# Patient Record
Sex: Female | Born: 1985 | Race: White | Hispanic: No | Marital: Married
Health system: Midwestern US, Community
[De-identification: ages and names within clinical notes are randomized; demographics above are authoritative.]

## PROBLEM LIST (undated history)

## (undated) DIAGNOSIS — R569 Unspecified convulsions: Secondary | ICD-10-CM

## (undated) DIAGNOSIS — F32A Depression, unspecified: Secondary | ICD-10-CM

## (undated) DIAGNOSIS — F329 Major depressive disorder, single episode, unspecified: Secondary | ICD-10-CM

## (undated) DIAGNOSIS — F319 Bipolar disorder, unspecified: Secondary | ICD-10-CM

## (undated) DIAGNOSIS — F419 Anxiety disorder, unspecified: Secondary | ICD-10-CM

## (undated) HISTORY — PX: TONSILLECTOMY: SUR1361

## (undated) HISTORY — PX: THORACOTOMY: SUR1349

## (undated) HISTORY — DX: Major depressive disorder, single episode, unspecified: F32.9

## (undated) HISTORY — DX: Depression, unspecified: F32.A

## (undated) HISTORY — PX: TUBAL LIGATION: SHX77

## (undated) HISTORY — PX: DILATION AND CURETTAGE OF UTERUS: SHX78

---

## 2007-11-27 ENCOUNTER — Emergency Department: Payer: Self-pay | Admitting: Emergency Medicine

## 2008-03-03 ENCOUNTER — Emergency Department: Payer: Self-pay | Admitting: Emergency Medicine

## 2008-11-09 ENCOUNTER — Emergency Department: Payer: Self-pay | Admitting: Emergency Medicine

## 2009-03-03 ENCOUNTER — Observation Stay: Payer: Self-pay | Admitting: Obstetrics and Gynecology

## 2009-03-05 ENCOUNTER — Inpatient Hospital Stay: Payer: Self-pay

## 2011-06-07 ENCOUNTER — Emergency Department: Payer: Self-pay | Admitting: Emergency Medicine

## 2011-06-14 ENCOUNTER — Emergency Department: Payer: Self-pay | Admitting: Emergency Medicine

## 2011-10-24 ENCOUNTER — Emergency Department: Payer: Self-pay | Admitting: Unknown Physician Specialty

## 2012-02-10 ENCOUNTER — Emergency Department: Payer: Self-pay | Admitting: Emergency Medicine

## 2012-04-25 LAB — COMPREHENSIVE METABOLIC PANEL
Anion Gap: 7 (ref 7–16)
Co2: 27 mmol/L (ref 21–32)
Creatinine: 1.05 mg/dL (ref 0.60–1.30)
EGFR (African American): >60
EGFR (Non-African Amer.): >60
Glucose: 107 mg/dL — ABNORMAL HIGH (ref 65–99)
SGOT(AST): 19 U/L (ref 15–37)
Sodium: 141 mmol/L (ref 136–145)
Total Protein: 7.1 g/dL (ref 6.4–8.2)

## 2012-04-25 LAB — ETHANOL
Ethanol %: <0.003 % (ref 0.000–0.080)
Ethanol: <3 mg/dL

## 2012-04-25 LAB — DRUG SCREEN, URINE
Amphetamines, Ur Screen: NEGATIVE (ref ?–1000)
Benzodiazepine, Ur Scrn: POSITIVE (ref ?–200)
Cannabinoid 50 Ng, Ur ~~LOC~~: NEGATIVE (ref ?–50)
Phencyclidine (PCP) Ur S: NEGATIVE (ref ?–25)

## 2012-04-25 LAB — URINALYSIS, COMPLETE
Blood: NEGATIVE
Glucose,UR: NEGATIVE mg/dL (ref 0–75)
Ketone: NEGATIVE
Nitrite: NEGATIVE
Protein: NEGATIVE
RBC,UR: 2 /HPF (ref 0–5)

## 2012-04-25 LAB — CBC
HCT: 41 % (ref 35.0–47.0)
HGB: 13.7 g/dL (ref 12.0–16.0)
MCH: 31.1 pg (ref 26.0–34.0)
MCHC: 33.3 g/dL (ref 32.0–36.0)
RBC: 4.39 10*6/uL (ref 3.80–5.20)
RDW: 14.2 % (ref 11.5–14.5)
WBC: 9.8 10*3/uL (ref 3.6–11.0)

## 2012-04-25 LAB — TSH: Thyroid Stimulating Horm: 0.717 u[IU]/mL

## 2012-04-26 ENCOUNTER — Inpatient Hospital Stay: Payer: Self-pay | Admitting: Psychiatry

## 2012-05-06 ENCOUNTER — Emergency Department: Payer: Self-pay | Admitting: Emergency Medicine

## 2012-05-15 ENCOUNTER — Emergency Department: Payer: Self-pay | Admitting: Emergency Medicine

## 2012-05-15 LAB — URINALYSIS, COMPLETE
Ketone: NEGATIVE
Nitrite: NEGATIVE
Ph: 6 (ref 4.5–8.0)
Protein: NEGATIVE
RBC,UR: <1 /HPF (ref 0–5)
WBC UR: 3 /HPF (ref 0–5)

## 2012-05-15 LAB — CBC
MCH: 31.1 pg (ref 26.0–34.0)
MCV: 95 fL (ref 80–100)
Platelet: 190 10*3/uL (ref 150–440)
RBC: 4.24 10*6/uL (ref 3.80–5.20)
WBC: 9.5 10*3/uL (ref 3.6–11.0)

## 2012-05-15 LAB — BASIC METABOLIC PANEL
Anion Gap: 7 (ref 7–16)
BUN: 8 mg/dL (ref 7–18)
Chloride: 104 mmol/L (ref 98–107)
Co2: 29 mmol/L (ref 21–32)
EGFR (African American): >60
Glucose: 86 mg/dL (ref 65–99)
Osmolality: 277 (ref 275–301)
Sodium: 140 mmol/L (ref 136–145)

## 2012-05-15 LAB — HCG, QUANTITATIVE, PREGNANCY: Beta Hcg, Quant.: 17601 m[IU]/mL — ABNORMAL HIGH

## 2012-05-15 LAB — WET PREP, GENITAL

## 2012-08-23 ENCOUNTER — Observation Stay: Payer: Self-pay | Admitting: Obstetrics and Gynecology

## 2012-08-23 LAB — URINALYSIS, COMPLETE
Bilirubin,UR: NEGATIVE
Glucose,UR: NEGATIVE mg/dL (ref 0–75)
Ketone: NEGATIVE
Nitrite: NEGATIVE
Ph: 7 (ref 4.5–8.0)
RBC,UR: NONE SEEN /HPF (ref 0–5)
Squamous Epithelial: 5
WBC UR: 1 /HPF (ref 0–5)

## 2012-10-09 ENCOUNTER — Observation Stay: Payer: Self-pay | Admitting: Obstetrics and Gynecology

## 2012-10-09 LAB — URINALYSIS, COMPLETE
Blood: NEGATIVE
Glucose,UR: NEGATIVE mg/dL (ref 0–75)
Nitrite: NEGATIVE
Ph: 7 (ref 4.5–8.0)
Protein: NEGATIVE
Specific Gravity: 1.013 (ref 1.003–1.030)
WBC UR: 3 /HPF (ref 0–5)

## 2012-10-09 LAB — FETAL FIBRONECTIN
Appearance: NORMAL
Fetal Fibronectin: NEGATIVE

## 2012-12-08 ENCOUNTER — Observation Stay: Payer: Self-pay

## 2012-12-08 LAB — CBC WITH DIFFERENTIAL/PLATELET
Bands: 2 %
Eosinophil: 1 %
HCT: 25.8 % — ABNORMAL LOW (ref 35.0–47.0)
Lymphocytes: 21 %
MCHC: 32.9 g/dL (ref 32.0–36.0)
MCV: 82 fL (ref 80–100)
Monocytes: 8 %
NRBC/100 WBC: 1 /
RBC: 3.16 10*6/uL — ABNORMAL LOW (ref 3.80–5.20)
Segmented Neutrophils: 68 %

## 2013-01-05 ENCOUNTER — Observation Stay: Payer: Self-pay | Admitting: Obstetrics & Gynecology

## 2013-01-07 ENCOUNTER — Inpatient Hospital Stay: Payer: Self-pay | Admitting: Obstetrics & Gynecology

## 2013-01-07 LAB — CBC WITH DIFFERENTIAL/PLATELET
Bands: 2 %
HCT: 25 % — ABNORMAL LOW (ref 35.0–47.0)
Lymphocytes: 17 %
MCH: 24.3 pg — ABNORMAL LOW (ref 26.0–34.0)
MCHC: 31.7 g/dL — ABNORMAL LOW (ref 32.0–36.0)
MCV: 77 fL — ABNORMAL LOW (ref 80–100)
Metamyelocyte: 2 %
Monocytes: 5 %
Platelet: 184 10*3/uL (ref 150–440)
RBC: 3.26 10*6/uL — ABNORMAL LOW (ref 3.80–5.20)
Segmented Neutrophils: 73 %
WBC: 14 10*3/uL — ABNORMAL HIGH (ref 3.6–11.0)

## 2013-01-08 LAB — HEMATOCRIT: HCT: 20.9 % — ABNORMAL LOW (ref 35.0–47.0)

## 2013-01-08 LAB — HEMOGLOBIN: HGB: 6.8 g/dL — ABNORMAL LOW (ref 12.0–16.0)

## 2013-01-09 LAB — HEMATOCRIT: HCT: 25.2 % — ABNORMAL LOW (ref 35.0–47.0)

## 2013-01-09 LAB — PATHOLOGY REPORT

## 2014-12-27 ENCOUNTER — Emergency Department: Payer: Self-pay | Admitting: Emergency Medicine

## 2015-01-18 ENCOUNTER — Inpatient Hospital Stay: Payer: Self-pay | Admitting: Psychiatry

## 2015-02-25 NOTE — Op Note (Signed)
PATIENT NAME:  Alicia ShinerLMER, Ayesha MR#:  [id number] DATE OF BIRTH:  08/06/1986  DATE OF PROCEDURE:  12/08/2012  PREOPERATIVE DIAGNOSES:  1.  Cervical incompetence.  2.  Cervical cerclage.  3.  Intrauterine pregnancy at 35 weeks and 5 days gestational age.   POSTOPERATIVE DIAGNOSES:  1.  Cervical incompetence.  2.  Cervical cerclage.  3.  Intrauterine pregnancy at 35 weeks and 5 days gestational age.   PROCEDURE: Removal of cerclage.   SURGEON: Thomasene MohairStephen Zeynab Klett, M.D.   ASSISTANT SURGEON: Marta Antuamara Brothers, CNM.   ANESTHESIA: Spinal.   ESTIMATED BLOOD LOSS: 50 mL.   OPERATIVE FLUIDS: 300 mL crystalloid.   COMPLICATIONS: None.   FINDINGS:  1.  Mersilene tape with knot at posterior cervix.  2.  Cervix dilated at 2 cm at the end of the procedure.   SPECIMENS: None.   CONDITION AT END OF PROCEDURE: Stable.   PROCEDURE IN DETAIL: The patient was taken to the operating room where spinal anesthesia was administered and found to be adequate. She was then placed in the dorsal supine high lithotomy position in candy cane stirrups and then prepped and draped in the usual sterile fashion. After a timeout was called, the bladder was straight catheterized for return of about 50 mL of clear urine. A small weighted speculum was placed in the posterior vagina and a right angle retractor was used to retract the anterior vaginal wall and a ring forceps was used to grasp the posterior lip of the cervix and deflect it anteriorly so that the posterior knot could be appreciated. The knot was grasped with Allis clamps and then this was followed as the knot was somewhat embedded into the cervix. Extraction with gentle traction was placed and the knot was gently pulled out of the cervical embedding. Once the cervical embedding portion of the knot was appreciated, the portion of the knot that was embedded in the cervix was removed completely from the cervix and the cerclage could be visualized. It was cut with suture  scissors, and the entire cerclage was removed from the cervix. There was one portion of the cervix that was bleeding and a single stitch of figure-of-eight with 3-0 Vicryl was thrown for hemostasis. At the end of the procedure, the cervix was checked again for any other knots or additional stitches that might have been thrown and none were found. Cervix was then checked and was found to be dilated 2 cm. Hemostasis was obtained with just mild oozing at the end of the procedure.   This concluded the dictation. The patient was taken to the PACU in stable condition. All instrument, needle and sponge counts were correct. The vagina was checked at the end of procedure for sponges and none were left in the vagina.  ____________________________ Conard NovakStephen D. Shenea Giacobbe, MD sdj:aw D: 12/08/2012 22:55:14 ET T: 12/09/2012 11:08:51 ET JOB#: [id number]  cc: Conard NovakStephen D. Christophere Hillhouse, MD, <Dictator> Conard NovakSTEPHEN D Edeline Greening MD ELECTRONICALLY SIGNED 01/06/2013 21:13

## 2015-02-25 NOTE — Op Note (Signed)
PATIENT NAME:  Alicia Whitney, Alicia Whitney MR#:  [id number] DATE OF BIRTH:  15-Feb-1986  DATE OF PROCEDURE:  01/08/2013  PREOPERATIVE DIAGNOSES:  1. Term intrauterine pregnancy. 2. Failure to progress. 3. Cephalopelvic disproportion. 4. Desire for permanent sterility.   POSTOPERATIVE DIAGNOSES: 1. Term intrauterine pregnancy.  2. Failure to progress.  3. Cephalopelvic disproportion.  4. Desire for permanent sterility.   PROCEDURE:  1. Cesarean section.  2. Bilateral tubal ligation.   SURGEON: Dierdre Searles. Paul Harris, MD   ASSISTANT: Farrel Connersolleen Gutierrez, CNM   ANESTHESIA: Spinal.   ESTIMATED BLOOD LOSS: 100 mL.   COMPLICATIONS: None.   FINDINGS: Normal tubes, ovaries, and uterus. Viable female infant weighing 7 pounds 15 ounces with Apgar scores of 8 and 9 at 1 and 5 minutes, respectively.   DISPOSITION: To the recovery room in stable condition.   TECHNIQUE: The patient is prepped and draped in the usual sterile fashion after adequate anesthesia is obtained in the supine position on the operating room table. A scalpel is used to create a low transverse skin incision down to the level of the rectus fascia which is dissected bilaterally using Mayo scissors. The rectus muscles are then separated from the fascia and the midline, and the peritoneum is penetrated with dissection and retraction of bladder in an inferior fashion. A scalpel is then used to create a low transverse hysterotomy incision that is then extended by blunt dissection. Amniotomy reveals meconium. The infant's head is delivered without complication or need of a vacuum device. There is no nuchal cord noted. The infant is quickly delivered and once delivered is noted to have vigorous cry. The umbilical cord is clamped and cut, and the infant is handed to the pediatric team to continue resuscitative measures.   Cord blood is obtained and the placenta is manually extracted. The uterus is externalized and cleansed of all membranes and debris using a  moist sponge. The hysterotomy incision is closed with a running #1 Vicryl suture in a locking fashion followed by a second layer to embrocate the first layer with excellent hemostasis noted. Interceed is placed over the incision.   The uterus is placed back in the intra-abdominal cavity, and the paracolic gutters are irrigated with warm saline.   While the uterus is externalized, the left and right fallopian tubes are grasped with a Babcock clamp, and a loop is tied with 2 Vicryl sutures, excised, and cauterized. Excellent hemostasis is noted at the tubal sites. Tubes are sent to Pathology for further review.   The peritoneum is closed with a 0 Vicryl suture. Trocars are placed through the abdominal wall into the subfascial space and are used to thread Silver Soaker catheters into the subfascial space for administration of continuous infusion of bupivacaine to the On-Q pump. The rectus fascia is closed with a 0 Maxon suture. Subcutaneous tissues are irrigated and hemostasis is assured using electrocautery. Skin is closed with a 4-0 Vicryl suture in a subcuticular fashion, followed by placement of Dermabond over the skin and to stabilize the catheters in place. The catheters are flushed with 5 mL each of bupivacaine and then stabilized further in place with Steri-Strips and a Tegaderm bandage. The patient goes to the recovery room in stable condition. All sponge, instrument and needle counts are correct.  ____________________________ R. Annamarie MajorPaul Harris, MD rph:cb D: 01/08/2013 09:26:07 ET T: 01/08/2013 09:35:25 ET JOB#: [id number]  cc: Dierdre Searles. Paul Harris, MD, <Dictator> Nadara MustardOBERT P HARRIS MD ELECTRONICALLY SIGNED 01/09/2013 7:42
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
## 2015-03-06 NOTE — Consult Note (Signed)
PATIENT NAME:  Alicia Whitney, PRUETT MR#:  [id number] DATE OF BIRTH:  03-10-1986  DATE OF CONSULTATION:  01/17/2015  REFERRING PHYSICIAN:   CONSULTING PHYSICIAN:  Audery Amel, MD  IDENTIFYING INFORMATION AND REASON FOR CONSULTATION: A 29 year old woman with a history of depression who presented to the Emergency Room.   CHIEF COMPLAINT: "I'm under a lot of stress."   HISTORY OF PRESENT ILLNESS: The patient tearfully states that her depression has become overwhelming over the last week or so. She has multiple stresses including mourning over her children, who she has all had taken away from her one way or another. She feels like her living situation is very stressful, living with her boyfriend and his family as well. She had had a depressed mood with negative feelings about herself constantly. Sleeping very poorly. Has nightmares at night. Has been having suicidal ideation, specifically with thoughts about cutting herself. She has not yet acted on it. She is not having any homicidal ideation; denies any hallucinations or psychotic symptoms. The patient is not on any psychiatric medicine and has not been getting followup treatment for months. She also reports that she has been drinking increasingly over the last couple of months. Still says that she does not drink very much, but it has been going up. Also, finally admits that she has been using cocaine a couple of times a week over the last month or so.   PAST PSYCHIATRIC HISTORY: History of depression and possibly posttraumatic stress disorder; has been treated with antidepressants in the past. Last on Zoloft, Atarax, and trazodone combination while living in [place] earlier last year. She has a history of multiple suicide attempts and self-mutilations. Several hospitalizations.   PAST MEDICAL HISTORY: History of self-mutilation, but otherwise no significant medical problems.   SOCIAL HISTORY: The patient is living with her boyfriend as well as several  members of his extended family. She has had several children, but apparently all of them have been taken, either by DSS custody or in custody disputes involving other members of her family. She works part time at [employer]. [employer]ota.   SUBSTANCE ABUSE HISTORY: Previously had not had a significant substance abuse problem but says that over the last several months she has been drinking increasing amounts. Also using cocaine (Dictation Anomaly) <<regularlyMISSING TEXT>>.   FAMILY HISTORY: She says multiple people on her mother's side of the family have depression and anxiety problems.   CURRENT MEDICATIONS: None.   ALLERGIES: ONIONS.   REVIEW OF SYSTEMS: Depressed mood. Poor sleep. Frequent nightmares. Positive suicidal ideation.   MENTAL STATUS EXAMINATION: Disheveled woman, looks overwrought. Cooperative with the interview. Eye contact poor. Psychomotor activity fidgety, frequently wracked by sobs during the interview. Affect is tearful and sobbing. Mood is stated as being very depressed. Thoughts are a little bit scattered and disorganized, but not psychotic. Denies auditory or visual hallucinations. Endorses suicidal ideation; no homicidal ideation. The patient is alert and oriented x 4, can repeat 3 objects immediately, remembers 2 out of 3 at 3 minutes. Judgment and insight at least partially intact. Intelligence normal.   LABORATORY RESULTS: Alcohol level negative. Chemistry panel all unremarkable. Drug screen positive for cocaine. CBC all unremarkable. Urinalysis: Trace leukocyte esterase, 1+ bacteria, probably does represent a urinary tract infection, although with only small numbers of white cells. Urine pregnancy test negative.   VITAL SIGNS: Blood pressure in the Emergency Room is 111/79, respirations 18, pulse 86, temperature 98.2.   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 The patient is having active suicidal ideation and has a history of suicide attempts in the past. Would benefit from hospitalization for safety and stabilization.   TREATMENT PLAN: Admit to psychiatry. Patient will be restarted on Zoloft, Atarax, and trazodone, as she found that combination helpful in the past. Suicide precautions and close precautions will be in effect. She will get treated for the probable urinary tract infection.   DIAGNOSIS, PRINCIPAL AND PRIMARY: AXIS I: Major depression, severe, recurrent.   SECONDARY DIAGNOSES:  AXIS I: 1.  Posttraumatic stress disorder.  2.  Cocaine abuse, moderate.  3.  Alcohol abuse, moderate.  AXIS II: Deferred.  AXIS III: Urinary tract infection.   ____________________________ Audery AmelJohn T. Clapacs, MD jtc:ST D: 01/17/2015 22:33:54 ET T: 01/17/2015 23:23:25 ET JOB#: [id number]  cc: Audery AmelJohn T. Clapacs, MD, <Dictator> Audery AmelJOHN T CLAPACS MD ELECTRONICALLY SIGNED 01/21/2015 10:17
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
## 2015-03-15 NOTE — H&P (Signed)
L&D Evaluation:  History:   HPI 29 yo [id number] at 2611w4d [facility] presenting with abdominal cramping and nausea/emesis and inability to keep food down for past day or so.  She has had cramps throughout her pregnancy.  This most recent cramping began last night and has persisted.  She notes no vaginal bleeding or big gush of fluid.  She notes positive fetal movement.  In addition given her history of cervical insufficiency she has a merselene [facility] as [facility].  She has been mostly getting 17ohpc since early second trimester for history of 23 week delivery. She    Presents with abdominal pain, nausea/vomiting    Patient's Medical History Anxiety, depression, bipolar disorder, history of seizure disorder (last seizure at age 29),    Patient's Surgical History D&C  cerclage placement, tonsillectomy    Medications Pre [place]atal Vitamins  Iron    Allergies NKDA    Social History none    Family History Non-Contributory   ROS:   ROS All systems were reviewed.  HEENT, CNS, GI, GU, Respiratory, CV, Renal and Musculoskeletal systems were found to be normal.   Exam:   Vital Signs stable    Urine Protein not completed    General no apparent distress    Mental Status clear    Abdomen gravid, non-tender    Estimated Fetal Weight Average for gestational age    Back no CVAT    Edema no edema    Pelvic no external lesions, cervix closed and thick, cerclage in place, no tension noted on cerclage, and no old blood or active bleeding noted on exam    Mebranes Intact, negative wet mount and ferning    FHT normal rate with no decels    Ucx absent    Other UA (pertinent only): trace LE, 1+ bacteria Wet Mount: + trich (diagnosed 2'ish weeks ago and treated) fFN negative   Impression:   Impression abdominal cramping, nausea and vomiting (not tolerating food by mouth)   Plan:   Plan UA, discharge    Comments - Gave another treatment dose for trichamonas -  send UA for culture - phenergan given with PO challenge (send home with Rx) - she is status post 1 liter of NS IV.    Follow Up Appointment already scheduled   Electronic Signatures: Conard NovakJackson, Burkley Dech D (MD)  (Signed 05-Dec-13 22:20)  Authored: L&D Evaluation   Last Updated: 05-Dec-13 22:20 by Conard NovakJackson, Lorna Strother D (MD)

## 2015-03-15 NOTE — H&P (Signed)
L&D Evaluation:  History:  HPI Alicia Whitney G4P1102 at 3872w0d by 10wk U/S presents with c/o contractions.  Ob history complicated by h/o 23wk PTB, with cerclage and 17OHP this pregnancy (cerclage removed previously).  Last cervical exam a few days ago 2cm.  [facility]  PNL - AB+, RI, VI, chlam + with neg TOC 09/22/12, trich s/p treatment.   Presents with contractions   Patient's Medical History Anemia, bipolar, depression   Patient's Surgical History cerclage, D&C, tonsillectomy, ear lobe repair   Medications Pre Natal Vitamins   Allergies NKDA   Social History none   Family History Non-Contributory   ROS:  ROS All systems were reviewed.  HEENT, CNS, GI, GU, Respiratory, CV, Renal and Musculoskeletal systems were found to be normal.   Exam:  Vital Signs stable   General no apparent distress   Mental Status clear   Chest nl effort   Abdomen gravid, non-tender   Estimated Fetal Weight Average for gestational age   Pelvic 2.5 / 25 / -2 per RN   Mebranes Intact   FHT normal rate with no decels, reactive NST   Ucx irregular, q3-505mins   Impression:  Impression [id number] at 5172w0d in latent labor   Plan:  Plan discharge   Comments - labor precautions reviewed   Follow Up Appointment already scheduled   Electronic Signatures: Orvan FalconerStansbury Clipp, Cilicia Borden K (MD)  (Signed 03-Mar-14 01:11)  Authored: L&D Evaluation   Last Updated: 03-Mar-14 01:11 by Garnette GunnerStansbury Clipp, Ali LoweEryn K (MD)

## 2015-03-15 NOTE — H&P (Signed)
L&D Evaluation:  History:   HPI 29 yo [id number] at [facility]red[facility] presenting with vaginal bleeding.  The patient went to the bathroom this evening and passed a quarter size clot. No further bleeding since that time.  Reports some dysuria as well as intermitten sharp pain bilateral groins, worse with movement and standing.  She has a history of a prior UTI this pregnancy [facility].  In addition given her history of cervical insufficiency she has a merselene [facility].  She was receiving progestone injection on top of the cerclage but has not received any in the last 3 weeks as she transitioned back [facility].  She is scheduled for anatomy US on 08/28/2011.    Presents with abdominal pain, vaginal bleeding    Patient's Medical History Anxiety, depression, bipolar disorder, history of seizure disorder (last seizure at age 29),    Patient's Surgical History D&C  cerclage placement, tonsillectomy    Medications Pre [place]atal Vitamins  Iron    Allergies NKDA    Social History none    Family History Non-Contributory   ROS:   ROS 10 point review of systems negtive unless otherwise noted in HPI   Exam:   Vital Signs stable    Urine Protein not completed, UA pending    General no apparent distress    Mental Status clear    Abdomen gravid, non-tender    Estimated Fetal Weight Average for gestational age    Back no CVAT    Edema no edema    Pelvic no external lesions, cervix closed and thick, cerclage in place, no tension noted on cerclage, and no old blood or active bleeding noted on exam    Mebranes Intact, negative wet mount and ferning    FHT +FHT's    Ucx absent   Impression:   Impression Vaginal bleeding and dysuria   Plan:   Comments - No evidence of any bleeding on exam today, may be cervical mucous, irritation from cerclage, or urinary is origin.  Does ont appear to be labor or cerclage pulling though  cervix. - Given dysuria, history of UTI will send UA and monitor contractions in the interim.  No contractions noted in first 20 minutes of monitoring.    Follow Up Appointment already scheduled   Electronic Signatures for Addendum Section:  Lorrene ReidStaebler, Niaja Stickley M (MD) (Signed Addendum 19-Oct-13 23:34)  UA reviewed not consistent with UTI given routine preterm labor precuations follow up in clinic as noted above   Electronic Signatures: Lorrene ReidStaebler, Maryalyce Sanjuan M (MD)  (Signed 19-Oct-13 23:12)  Authored: L&D Evaluation   Last Updated: 19-Oct-13 23:34 by Lorrene ReidStaebler, Sapphira Harjo M (MD)

## 2016-04-25 ENCOUNTER — Emergency Department: Payer: Self-pay

## 2016-04-25 ENCOUNTER — Emergency Department
Admission: EM | Admit: 2016-04-25 | Discharge: 2016-04-25 | Disposition: A | Discharge status: Home / Self Care | Payer: Self-pay | Attending: Emergency Medicine | Admitting: Emergency Medicine

## 2016-04-25 ENCOUNTER — Encounter: Payer: Self-pay | Admitting: Emergency Medicine

## 2016-04-25 DIAGNOSIS — J189 Pneumonia, unspecified organism: Secondary | ICD-10-CM

## 2016-04-25 DIAGNOSIS — J181 Lobar pneumonia, unspecified organism: Secondary | ICD-10-CM | POA: Insufficient documentation

## 2016-04-25 DIAGNOSIS — R509 Fever, unspecified: Secondary | ICD-10-CM

## 2016-04-25 DIAGNOSIS — Z87891 Personal history of nicotine dependence: Secondary | ICD-10-CM | POA: Insufficient documentation

## 2016-04-25 DIAGNOSIS — N83201 Unspecified ovarian cyst, right side: Secondary | ICD-10-CM | POA: Insufficient documentation

## 2016-04-25 DIAGNOSIS — R109 Unspecified abdominal pain: Secondary | ICD-10-CM

## 2016-04-25 DIAGNOSIS — Z79899 Other long term (current) drug therapy: Secondary | ICD-10-CM | POA: Insufficient documentation

## 2016-04-25 LAB — URINALYSIS COMPLETE WITH MICROSCOPIC (ARMC ONLY)
Bilirubin Urine: NEGATIVE
Glucose, UA: NEGATIVE mg/dL
HGB URINE DIPSTICK: NEGATIVE
Ketones, ur: NEGATIVE mg/dL
NITRITE: NEGATIVE
PH: 7 (ref 5.0–8.0)
PROTEIN: NEGATIVE mg/dL
Specific Gravity, Urine: 1.005 (ref 1.005–1.030)

## 2016-04-25 LAB — CBC
HEMATOCRIT: 39.5 % (ref 35.0–47.0)
HEMOGLOBIN: 13.3 g/dL (ref 12.0–16.0)
MCH: 30.8 pg (ref 26.0–34.0)
MCHC: 33.6 g/dL (ref 32.0–36.0)
MCV: 91.8 fL (ref 80.0–100.0)
Platelets: 181 10*3/uL (ref 150–440)
RBC: 4.3 MIL/uL (ref 3.80–5.20)
RDW: 12.9 % (ref 11.5–14.5)
WBC: 6.1 10*3/uL (ref 3.6–11.0)

## 2016-04-25 LAB — LIPASE, BLOOD: Lipase: 25 U/L (ref 11–51)

## 2016-04-25 LAB — COMPREHENSIVE METABOLIC PANEL
ALBUMIN: 4.6 g/dL (ref 3.5–5.0)
ALT: 8 U/L — AB (ref 14–54)
ANION GAP: 7 (ref 5–15)
AST: 16 U/L (ref 15–41)
Alkaline Phosphatase: 67 U/L (ref 38–126)
BILIRUBIN TOTAL: 0.7 mg/dL (ref 0.3–1.2)
BUN: 12 mg/dL (ref 6–20)
CHLORIDE: 106 mmol/L (ref 101–111)
CO2: 26 mmol/L (ref 22–32)
Calcium: 9.5 mg/dL (ref 8.9–10.3)
Creatinine, Ser: 0.78 mg/dL (ref 0.44–1.00)
GFR calc Af Amer: >60 mL/min (ref >60)
GLUCOSE: 103 mg/dL — AB (ref 65–99)
POTASSIUM: 4 mmol/L (ref 3.5–5.1)
Sodium: 139 mmol/L (ref 135–145)
TOTAL PROTEIN: 7.6 g/dL (ref 6.5–8.1)

## 2016-04-25 MED ORDER — MORPHINE SULFATE (PF) 4 MG/ML IV SOLN
4.0000 mg | Freq: Once | INTRAVENOUS | Status: AC
  Administered 2016-04-25: 4 mg via INTRAVENOUS
  Filled 2016-04-25: qty 1

## 2016-04-25 MED ORDER — IOPAMIDOL (ISOVUE-300) INJECTION 61%
100.0000 mL | Freq: Once | INTRAVENOUS | Status: AC | PRN
  Administered 2016-04-25: 100 mL via INTRAVENOUS

## 2016-04-25 MED ORDER — SODIUM CHLORIDE 0.9 % IV BOLUS (SEPSIS)
1000.0000 mL | Freq: Once | INTRAVENOUS | Status: AC
  Administered 2016-04-25: 1000 mL via INTRAVENOUS

## 2016-04-25 MED ORDER — ONDANSETRON HCL 4 MG/2ML IJ SOLN
4.0000 mg | Freq: Once | INTRAMUSCULAR | Status: AC
  Administered 2016-04-25: 4 mg via INTRAVENOUS
  Filled 2016-04-25: qty 2

## 2016-04-25 MED ORDER — TRAMADOL HCL 50 MG PO TABS: 50.0000 mg | ORAL_TABLET | Freq: Four times a day (QID) | ORAL | Status: DC | PRN

## 2016-04-25 MED ORDER — DIATRIZOATE MEGLUMINE & SODIUM 66-10 % PO SOLN
15.0000 mL | Freq: Once | ORAL | Status: AC
  Administered 2016-04-25: 15 mL via ORAL

## 2016-04-25 MED ORDER — AMOXICILLIN-POT CLAVULANATE 875-125 MG PO TABS: 1.0000 | ORAL_TABLET | Freq: Two times a day (BID) | ORAL | Status: AC

## 2016-04-25 MED ORDER — DEXTROSE 5 % IV SOLN
1.0000 g | Freq: Once | INTRAVENOUS | Status: AC
  Administered 2016-04-25: 1 g via INTRAVENOUS
  Filled 2016-04-25: qty 10

## 2016-04-25 MED ORDER — KETOROLAC TROMETHAMINE 30 MG/ML IJ SOLN
30.0000 mg | Freq: Once | INTRAMUSCULAR | Status: AC
  Administered 2016-04-25: 30 mg via INTRAVENOUS
  Filled 2016-04-25: qty 1

## 2016-04-25 MED ORDER — IBUPROFEN 800 MG PO TABS: 800.0000 mg | ORAL_TABLET | Freq: Three times a day (TID) | ORAL | Status: DC | PRN

## 2016-04-25 NOTE — ED Notes (Signed)
Patient presents to the ED with right lower quadrant pain, dizziness, nausea and vomiting with fever of 103 at 6am.  Patient states she took tylenol.  Patient is guarding her right lower quadrant pain.  Patient reports having had a tubal ligation.  Pressure to the RLQ improves pain with severe rebound tenderness.  Patient appears uncomfortable during triage.

## 2016-04-25 NOTE — ED Provider Notes (Signed)
[facility] Emergency Department Provider Note   ____________________________________________  Time seen: Approximately 12:16 PM  I have reviewed the triage vital signs and the nursing notes.   HISTORY  Chief Complaint Abdominal Pain and Emesis    HPI Alicia Whitney is a 30 y.o. female who is complaining of diffuse mid abdominal pain that has progressively worsened since early this morning. Patient states she had a fever of 103 as well as she has vomited several times. Last emesis was on arrival to the ER. She denies any dysuria frequency or hematuria. She denies any diarrhea or constipation. She also denies any cough or cold symptoms. Patient states her pain on scale of 0-10 at this time is a 10.   History reviewed. No pertinent past medical history.  There are no active problems to display for this patient.   History reviewed. No pertinent past surgical history.  Current Outpatient Rx  Name  Route  Sig  Dispense  Refill  . acetaminophen (TYLENOL) 325 MG tablet   Oral   Take 650 mg by mouth every 6 (six) hours as needed for moderate pain.         Marland Kitchen amoxicillin-clavulanate (AUGMENTIN) 875-125 MG tablet   Oral   Take 1 tablet by mouth every 12 (twelve) hours.   20 tablet   0   . ibuprofen (ADVIL,MOTRIN) 800 MG tablet   Oral   Take 1 tablet (800 mg total) by mouth every 8 (eight) hours as needed.   30 tablet   0   . traMADol (ULTRAM) 50 MG tablet   Oral   Take 1 tablet (50 mg total) by mouth every 6 (six) hours as needed.   20 tablet   0     Allergies Review of patient's allergies indicates no known allergies.  No family history on file.  Social History Social History  Substance Use Topics  . Smoking status: Former Games developer  . Smokeless tobacco: None  . Alcohol Use: No    Review of Systems Constitutional: No fever/chills Eyes: No visual changes. ENT: No sore throat. Cardiovascular: Denies chest pain. Respiratory: Denies  shortness of breath. Gastrointestinal: Positive for abdominal pain. Nausea for nausea and vomiting several times.  No diarrhea.  No constipation. Genitourinary: Negative for dysuria. Musculoskeletal: Negative for back pain. Skin: Negative for rash. Neurological: Negative for headaches, focal weakness or numbness.  10-point ROS otherwise negative.  ____________________________________________   PHYSICAL EXAM:  VITAL SIGNS: ED Triage Vitals  Enc Vitals Group     BP 04/25/16 1131 125/87 mmHg     Pulse Rate 04/25/16 1131 92     Resp 04/25/16 1131 18     Temp 04/25/16 1131 98.7 F (37.1 C)     Temp Source 04/25/16 1131 Oral     SpO2 04/25/16 1131 99 %     Weight 04/25/16 1131 132 lb (59.875 kg)     Height 04/25/16 1131 5\' 6"  (1.676 m)     Head Cir --      Peak Flow --      Pain Score 04/25/16 1132 6     Pain Loc --      Pain Edu? --      Excl. in GC? --     Constitutional: Alert and oriented. Well appearing and in 100 acute distress from her pain. Eyes: Conjunctivae are normal. PERRL. EOMI. Head: Atraumatic. Nose: No congestion/rhinnorhea. Mouth/Throat: Mucous membranes are moist.  Oropharynx non-erythematous. Neck: No stridor.   Cardiovascular: Normal rate,  regular rhythm. Grossly normal heart sounds.  Good peripheral circulation. Respiratory: Normal respiratory effort.  No retractions. Lungs CTAB. Gastrointestinal: Soft and mild to moderate tenderness in her periumbilical and suprapubic area, no rebound or guarding or peritoneal signs. No distention. No abdominal bruits. No CVA tenderness. Musculoskeletal: No lower extremity tenderness nor edema.  No joint effusions. Neurologic:  Normal speech and language. No gross focal neurologic deficits are appreciated. No gait instability. Skin:  Skin is warm, dry and intact. No rash noted. Psychiatric: Mood and affect are normal. Speech and behavior are normal.  ____________________________________________   LABS (all labs  ordered are listed, but only abnormal results are displayed)  Labs Reviewed  COMPREHENSIVE METABOLIC PANEL - Abnormal; Notable for the following:    Glucose, Bld 103 (*)    ALT 8 (*)    All other components within normal limits  URINALYSIS COMPLETEWITH MICROSCOPIC ([facility] ONLY) - Abnormal; Notable for the following:    Color, Urine STRAW (*)    APPearance CLEAR (*)    Leukocytes, UA TRACE (*)    Bacteria, UA RARE (*)    Squamous Epithelial / LPF 0-5 (*)    All other components within normal limits  CULTURE, BLOOD (ROUTINE X 2)  CULTURE, BLOOD (ROUTINE X 2)  LIPASE, BLOOD  CBC  POC URINE PREG, ED   ____________________________________________  EKG   ____________________________________________  RADIOLOGY  Dg Chest 2 View  04/25/2016  CLINICAL DATA:  30 year old female with history of right lower quadrant abdominal pain. Dizziness and nausea with vomiting and fever to 103 degrees this morning at 6 a.m. Right lower quadrant abdominal pain. EXAM: CHEST  2 VIEW COMPARISON:  No priors. FINDINGS: Lung volumes are normal. No consolidative airspace disease. No pleural effusions. No pneumothorax. No pulmonary nodule or mass noted. Pulmonary vasculature and the cardiomediastinal silhouette are within normal limits. IMPRESSION: No radiographic evidence of acute cardiopulmonary disease. Electronically Signed   By: Trudie Reedaniel  Entrikin M.D.   On: 04/25/2016 13:42   Ct Abdomen Pelvis W Contrast  04/25/2016  CLINICAL DATA:  Right lower quadrant abdominal pain with fever, nausea, and vomiting starting this morning. EXAM: CT ABDOMEN AND PELVIS WITH CONTRAST TECHNIQUE: Multidetector CT imaging of the abdomen and pelvis was performed using the standard protocol following bolus administration of intravenous contrast. CONTRAST:  100mL ISOVUE-300 IOPAMIDOL (ISOVUE-300) INJECTION 61% COMPARISON:  12/27/2014 FINDINGS: Lower chest: 1.3 by 0.9 cm focal airspace opacity in the left lower lobe, image 11/4, not  present on 12/27/2014 Hepatobiliary: Unremarkable Pancreas: Unremarkable Spleen: Unremarkable Adrenals/Urinary Tract: Unremarkable Stomach/Bowel: Unremarkable.  Appendix normal. Vascular/Lymphatic: Unremarkable Reproductive: Mildly retroverted uterus. Ovarian contours normal. 2.1 by 1.6 cm cyst of the right ovary inferiorly. Other: No supplemental non-categorized findings. Musculoskeletal: Unremarkable IMPRESSION: 1. Small focal likely inflammatory airspace opacity in the left lower lobe medially. This may reflect atelectasis or less likely a small focus of pneumonia. This was not present on the prior exam from last year. Particularly in light of the patient's age, I am not suspicious or specially concerned about malignancy. 2. Small right ovarian cyst. 3. The appendix appears normal and the cause for the patient' s right lower quadrant abdominal pain is not readily apparent. Electronically Signed   By: Gaylyn RongWalter  Liebkemann M.D.   On: 04/25/2016 15:24    ____________________________________________   PROCEDURES  Procedure(s) performed: None  Critical Care performed: No  ____________________________________________   INITIAL IMPRESSION / ASSESSMENT AND PLAN / ED COURSE  Pertinent labs & imaging results that were available during my care  of the patient were reviewed by me and considered in my medical decision making (see chart for details).  3:43 PM Patient was given IV fluids pain and nausea medicine while awaiting labs and CT.  3:43 PM Patient's CT abdomen pelvis shows an early pneumonia in her left lower lobe well as a small right ovarian cyst. The fever is probably explained per the pneumonia and we will give patient follow up with OB/GYN concerning her right ovarian cyst. Patient is going to be given a dose IV Rocephin as well as some IV Toradol prior to discharge. Patient will be sent home on Augmentin, ibuprofen, and tramadol. ____________________________________________   FINAL  CLINICAL IMPRESSION(S) / ED DIAGNOSES  Final diagnoses:  Fever and chills  Acute abdominal pain  Right ovarian cyst  Left lower lobe pneumonia      NEW MEDICATIONS STARTED DURING THIS VISIT:  New Prescriptions   AMOXICILLIN-CLAVULANATE (AUGMENTIN) 875-125 MG TABLET    Take 1 tablet by mouth every 12 (twelve) hours.   IBUPROFEN (ADVIL,MOTRIN) 800 MG TABLET    Take 1 tablet (800 mg total) by mouth every 8 (eight) hours as needed.   TRAMADOL (ULTRAM) 50 MG TABLET    Take 1 tablet (50 mg total) by mouth every 6 (six) hours as needed.     Note:  This document was prepared using Dragon voice recognition software and may include unintentional dictation errors.    Leona Carry, MD 04/25/16 [id number]

## 2016-04-25 NOTE — ED Notes (Signed)
Report received from Janie, RN. This RN assumed patient care. 

## 2016-04-25 NOTE — ED Notes (Signed)

## 2016-04-30 LAB — CULTURE, BLOOD (ROUTINE X 2)
Culture: NO GROWTH
Culture: NO GROWTH

## 2016-09-17 ENCOUNTER — Emergency Department
Admission: EM | Admit: 2016-09-17 | Discharge: 2016-09-17 | Disposition: A | Discharge status: Home / Self Care | Payer: No Typology Code available for payment source | Attending: Emergency Medicine | Admitting: Emergency Medicine

## 2016-09-17 ENCOUNTER — Emergency Department: Payer: No Typology Code available for payment source

## 2016-09-17 DIAGNOSIS — Y999 Unspecified external cause status: Secondary | ICD-10-CM | POA: Diagnosis not present

## 2016-09-17 DIAGNOSIS — S99922A Unspecified injury of left foot, initial encounter: Secondary | ICD-10-CM | POA: Diagnosis present

## 2016-09-17 DIAGNOSIS — S9032XA Contusion of left foot, initial encounter: Secondary | ICD-10-CM | POA: Insufficient documentation

## 2016-09-17 DIAGNOSIS — Y92481 Parking lot as the place of occurrence of the external cause: Secondary | ICD-10-CM | POA: Diagnosis not present

## 2016-09-17 DIAGNOSIS — Y9389 Activity, other specified: Secondary | ICD-10-CM | POA: Insufficient documentation

## 2016-09-17 DIAGNOSIS — Z87891 Personal history of nicotine dependence: Secondary | ICD-10-CM | POA: Diagnosis not present

## 2016-09-17 DIAGNOSIS — Z79899 Other long term (current) drug therapy: Secondary | ICD-10-CM | POA: Diagnosis not present

## 2016-09-17 LAB — POCT PREGNANCY, URINE: PREG TEST UR: NEGATIVE

## 2016-09-17 MED ORDER — MELOXICAM 15 MG PO TABS: 15.0000 mg | ORAL_TABLET | Freq: Every day | ORAL | 0 refills | Status: DC

## 2016-09-17 MED ORDER — MORPHINE SULFATE (PF) 4 MG/ML IV SOLN
4.0000 mg | Freq: Once | INTRAVENOUS | Status: DC
  Filled 2016-09-17: qty 1

## 2016-09-17 MED ORDER — HYDROMORPHONE HCL 1 MG/ML IJ SOLN
INTRAMUSCULAR | Status: AC
  Administered 2016-09-17: 1 mg via INTRAVENOUS
  Filled 2016-09-17: qty 1

## 2016-09-17 MED ORDER — ONDANSETRON 4 MG PO TBDP
4.0000 mg | ORAL_TABLET | Freq: Once | ORAL | Status: AC
  Administered 2016-09-17: 4 mg via ORAL
  Filled 2016-09-17: qty 1

## 2016-09-17 MED ORDER — HYDROMORPHONE HCL 1 MG/ML IJ SOLN
1.0000 mg | Freq: Once | INTRAMUSCULAR | Status: AC
  Administered 2016-09-17: 1 mg via INTRAVENOUS

## 2016-09-17 MED ORDER — HYDROCODONE-ACETAMINOPHEN 5-325 MG PO TABS: 1.0000 | ORAL_TABLET | ORAL | 0 refills | Status: DC | PRN
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
## 2016-11-18 ENCOUNTER — Emergency Department: Payer: Self-pay

## 2016-11-18 ENCOUNTER — Encounter: Payer: Self-pay | Admitting: Emergency Medicine

## 2016-11-18 ENCOUNTER — Emergency Department
Admission: EM | Admit: 2016-11-18 | Discharge: 2016-11-18 | Disposition: A | Discharge status: Home / Self Care | Payer: Self-pay | Attending: Emergency Medicine | Admitting: Emergency Medicine

## 2016-11-18 DIAGNOSIS — F32A Depression, unspecified: Secondary | ICD-10-CM

## 2016-11-18 DIAGNOSIS — N83202 Unspecified ovarian cyst, left side: Secondary | ICD-10-CM | POA: Insufficient documentation

## 2016-11-18 DIAGNOSIS — Z9851 Tubal ligation status: Secondary | ICD-10-CM | POA: Insufficient documentation

## 2016-11-18 DIAGNOSIS — R109 Unspecified abdominal pain: Secondary | ICD-10-CM

## 2016-11-18 DIAGNOSIS — Z87891 Personal history of nicotine dependence: Secondary | ICD-10-CM | POA: Insufficient documentation

## 2016-11-18 DIAGNOSIS — F329 Major depressive disorder, single episode, unspecified: Secondary | ICD-10-CM

## 2016-11-18 LAB — COMPREHENSIVE METABOLIC PANEL
ALK PHOS: 56 U/L (ref 38–126)
ALT: 9 U/L — AB (ref 14–54)
AST: 20 U/L (ref 15–41)
Albumin: 4.1 g/dL (ref 3.5–5.0)
Anion gap: 5 (ref 5–15)
BILIRUBIN TOTAL: 0.6 mg/dL (ref 0.3–1.2)
BUN: 13 mg/dL (ref 6–20)
CALCIUM: 9.1 mg/dL (ref 8.9–10.3)
CO2: 25 mmol/L (ref 22–32)
CREATININE: 0.88 mg/dL (ref 0.44–1.00)
Chloride: 107 mmol/L (ref 101–111)
GFR calc Af Amer: >60 mL/min (ref >60)
Glucose, Bld: 103 mg/dL — ABNORMAL HIGH (ref 65–99)
Potassium: 4.2 mmol/L (ref 3.5–5.1)
Sodium: 137 mmol/L (ref 135–145)
TOTAL PROTEIN: 7.1 g/dL (ref 6.5–8.1)

## 2016-11-18 LAB — WET PREP, GENITAL
Clue Cells Wet Prep HPF POC: NONE SEEN
SPERM: NONE SEEN
TRICH WET PREP: NONE SEEN
YEAST WET PREP: NONE SEEN

## 2016-11-18 LAB — URINALYSIS, COMPLETE (UACMP) WITH MICROSCOPIC
Bilirubin Urine: NEGATIVE
GLUCOSE, UA: NEGATIVE mg/dL
Hgb urine dipstick: NEGATIVE
Ketones, ur: NEGATIVE mg/dL
Leukocytes, UA: NEGATIVE
Nitrite: NEGATIVE
PROTEIN: NEGATIVE mg/dL
Specific Gravity, Urine: 1.019 (ref 1.005–1.030)
pH: 5 (ref 5.0–8.0)

## 2016-11-18 LAB — CBC
HEMATOCRIT: 40.2 % (ref 35.0–47.0)
HEMOGLOBIN: 13.8 g/dL (ref 12.0–16.0)
MCH: 32.2 pg (ref 26.0–34.0)
MCHC: 34.4 g/dL (ref 32.0–36.0)
MCV: 93.4 fL (ref 80.0–100.0)
Platelets: 157 10*3/uL (ref 150–440)
RBC: 4.3 MIL/uL (ref 3.80–5.20)
RDW: 12.6 % (ref 11.5–14.5)
WBC: 7.2 10*3/uL (ref 3.6–11.0)

## 2016-11-18 LAB — CHLAMYDIA/NGC RT PCR (ARMC ONLY)
CHLAMYDIA TR: NOT DETECTED
N gonorrhoeae: NOT DETECTED

## 2016-11-18 LAB — PREGNANCY, URINE: Preg Test, Ur: NEGATIVE

## 2016-11-18 LAB — LIPASE, BLOOD: Lipase: 27 U/L (ref 11–51)

## 2016-11-18 MED ORDER — MORPHINE SULFATE (PF) 4 MG/ML IV SOLN
4.0000 mg | Freq: Once | INTRAVENOUS | Status: AC
  Administered 2016-11-18: 4 mg via INTRAVENOUS
  Filled 2016-11-18: qty 1

## 2016-11-18 MED ORDER — HYDROCODONE-ACETAMINOPHEN 5-325 MG PO TABS: 1.0000 | ORAL_TABLET | ORAL | 0 refills | Status: DC | PRN

## 2016-11-18 MED ORDER — ONDANSETRON 4 MG PO TBDP
4.0000 mg | ORAL_TABLET | Freq: Once | ORAL | Status: AC | PRN
  Administered 2016-11-18: 4 mg via ORAL

## 2016-11-18 MED ORDER — ONDANSETRON 4 MG PO TBDP
ORAL_TABLET | ORAL | Status: AC
  Filled 2016-11-18: qty 1

## 2016-11-18 MED ORDER — OXYCODONE-ACETAMINOPHEN 5-325 MG PO TABS
1.0000 | ORAL_TABLET | ORAL | Status: DC | PRN
  Administered 2016-11-18: 1 via ORAL
  Filled 2016-11-18: qty 1
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
## 2016-11-18 NOTE — ED Notes (Signed)
Lab called to say they needed a new lavender tube; IV placed at this time

## 2016-11-18 NOTE — ED Notes (Signed)
Pt verbalized understanding of discharge instructions. NAD at this time. 

## 2016-11-18 NOTE — ED Notes (Signed)
Patient transported to Ultrasound 

## 2016-11-18 NOTE — ED Triage Notes (Addendum)
Patient with complaint of left lower abdominal pain times three hours. Patient denies nausea, vomiting, diarrhea or urinary symptoms. Patient states that she is feeling depressed and has not seen anyone for depression because she does not have insurance. Patient denies SI.

## 2017-01-07 ENCOUNTER — Encounter: Payer: Self-pay | Admitting: Emergency Medicine

## 2017-01-07 ENCOUNTER — Emergency Department
Admission: EM | Admit: 2017-01-07 | Discharge: 2017-01-08 | Disposition: A | Discharge status: Other Acute Inpatient Hospital | Payer: Self-pay | Attending: Emergency Medicine | Admitting: Emergency Medicine

## 2017-01-07 DIAGNOSIS — F329 Major depressive disorder, single episode, unspecified: Secondary | ICD-10-CM | POA: Insufficient documentation

## 2017-01-07 DIAGNOSIS — Z5181 Encounter for therapeutic drug level monitoring: Secondary | ICD-10-CM | POA: Insufficient documentation

## 2017-01-07 DIAGNOSIS — F332 Major depressive disorder, recurrent severe without psychotic features: Secondary | ICD-10-CM

## 2017-01-07 DIAGNOSIS — R45851 Suicidal ideations: Secondary | ICD-10-CM

## 2017-01-07 DIAGNOSIS — T424X2A Poisoning by benzodiazepines, intentional self-harm, initial encounter: Secondary | ICD-10-CM | POA: Insufficient documentation

## 2017-01-07 DIAGNOSIS — Z87891 Personal history of nicotine dependence: Secondary | ICD-10-CM | POA: Insufficient documentation

## 2017-01-07 HISTORY — DX: Bipolar disorder, unspecified: F31.9

## 2017-01-07 HISTORY — DX: Anxiety disorder, unspecified: F41.9

## 2017-01-07 LAB — COMPREHENSIVE METABOLIC PANEL
ALBUMIN: 5.2 g/dL — AB (ref 3.5–5.0)
ALK PHOS: 70 U/L (ref 38–126)
ALT: 11 U/L — ABNORMAL LOW (ref 14–54)
ANION GAP: 6 (ref 5–15)
AST: 20 U/L (ref 15–41)
BUN: 13 mg/dL (ref 6–20)
CALCIUM: 9.7 mg/dL (ref 8.9–10.3)
CHLORIDE: 106 mmol/L (ref 101–111)
CO2: 27 mmol/L (ref 22–32)
Creatinine, Ser: 0.88 mg/dL (ref 0.44–1.00)
GFR calc Af Amer: >60 mL/min (ref >60)
GFR calc non Af Amer: >60 mL/min (ref >60)
Glucose, Bld: 84 mg/dL (ref 65–99)
POTASSIUM: 3.9 mmol/L (ref 3.5–5.1)
SODIUM: 139 mmol/L (ref 135–145)
Total Bilirubin: 0.6 mg/dL (ref 0.3–1.2)
Total Protein: 8.4 g/dL — ABNORMAL HIGH (ref 6.5–8.1)

## 2017-01-07 LAB — ETHANOL: Alcohol, Ethyl (B): <5 mg/dL (ref <5)

## 2017-01-07 LAB — CBC
HEMATOCRIT: 41.3 % (ref 35.0–47.0)
Hemoglobin: 14.2 g/dL (ref 12.0–16.0)
MCH: 31.6 pg (ref 26.0–34.0)
MCHC: 34.3 g/dL (ref 32.0–36.0)
MCV: 92.1 fL (ref 80.0–100.0)
PLATELETS: 204 10*3/uL (ref 150–440)
RBC: 4.49 MIL/uL (ref 3.80–5.20)
RDW: 12.8 % (ref 11.5–14.5)
WBC: 8.3 10*3/uL (ref 3.6–11.0)

## 2017-01-07 LAB — URINE DRUG SCREEN, QUALITATIVE (ARMC ONLY)
AMPHETAMINES, UR SCREEN: NOT DETECTED
BENZODIAZEPINE, UR SCRN: NOT DETECTED
Barbiturates, Ur Screen: NOT DETECTED
Cannabinoid 50 Ng, Ur ~~LOC~~: NOT DETECTED
Cocaine Metabolite,Ur ~~LOC~~: NOT DETECTED
MDMA (ECSTASY) UR SCREEN: NOT DETECTED
METHADONE SCREEN, URINE: NOT DETECTED
Opiate, Ur Screen: NOT DETECTED
Phencyclidine (PCP) Ur S: NOT DETECTED
TRICYCLIC, UR SCREEN: NOT DETECTED

## 2017-01-07 LAB — ACETAMINOPHEN LEVEL

## 2017-01-07 LAB — SALICYLATE LEVEL

## 2017-01-07 NOTE — ED Notes (Signed)
Patient's father and best friend were here to visit. Visitation was declined at this time by Dr. Fanny BienQuale. Patient states this Clinical research associatewriter could update her father and friend, but didn't want to give them a password. Patient states she has a boyfriend that will get the password. Patient's friend Darel HongJudy took home the patient's purse and cellphone with the patient's permission.
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
## 2017-01-07 NOTE — ED Notes (Signed)
1:1 sitter at bedside.

## 2017-01-07 NOTE — ED Notes (Signed)
Tech Alicia Whitney is at bedside for 1:1 sitter.

## 2017-01-07 NOTE — ED Triage Notes (Addendum)
Pt presents to ED after she took 10 or 15 1mg  klonopin and another unknown medication. All mediations were taken at the same time this afternoon.  Pt states "I just cant take it no more. This life." Pt reports she "just dont want to feel anything". Has a mental health hx.  pt reports symptoms of blurred vision and dizziness. Pt tearful in triage. Accompanied by her girlfriend and her father. Pt states she just "wants to fall asleep and not wake up".

## 2017-01-08 ENCOUNTER — Inpatient Hospital Stay
Admission: AD | Admit: 2017-01-08 | Discharge: 2017-01-12 | DRG: 885 | Disposition: A | Discharge status: Home / Self Care | Payer: No Typology Code available for payment source | Source: Intra-hospital | Attending: Psychiatry | Admitting: Psychiatry

## 2017-01-08 DIAGNOSIS — R4587 Impulsiveness: Secondary | ICD-10-CM | POA: Diagnosis present

## 2017-01-08 DIAGNOSIS — Z91040 Latex allergy status: Secondary | ICD-10-CM | POA: Diagnosis not present

## 2017-01-08 DIAGNOSIS — Z91018 Allergy to other foods: Secondary | ICD-10-CM

## 2017-01-08 DIAGNOSIS — G4700 Insomnia, unspecified: Secondary | ICD-10-CM | POA: Diagnosis present

## 2017-01-08 DIAGNOSIS — Z87891 Personal history of nicotine dependence: Secondary | ICD-10-CM | POA: Diagnosis not present

## 2017-01-08 DIAGNOSIS — F4310 Post-traumatic stress disorder, unspecified: Secondary | ICD-10-CM | POA: Diagnosis present

## 2017-01-08 DIAGNOSIS — Z9119 Patient's noncompliance with other medical treatment and regimen: Secondary | ICD-10-CM | POA: Diagnosis not present

## 2017-01-08 DIAGNOSIS — F332 Major depressive disorder, recurrent severe without psychotic features: Principal | ICD-10-CM | POA: Diagnosis present

## 2017-01-08 DIAGNOSIS — F1210 Cannabis abuse, uncomplicated: Secondary | ICD-10-CM | POA: Diagnosis present

## 2017-01-08 DIAGNOSIS — F1420 Cocaine dependence, uncomplicated: Secondary | ICD-10-CM

## 2017-01-08 DIAGNOSIS — F1310 Sedative, hypnotic or anxiolytic abuse, uncomplicated: Secondary | ICD-10-CM | POA: Diagnosis present

## 2017-01-08 DIAGNOSIS — F1120 Opioid dependence, uncomplicated: Secondary | ICD-10-CM | POA: Diagnosis present

## 2017-01-08 DIAGNOSIS — T424X1A Poisoning by benzodiazepines, accidental (unintentional), initial encounter: Secondary | ICD-10-CM | POA: Diagnosis present

## 2017-01-08 MED ORDER — TRAZODONE HCL 100 MG PO TABS
100.0000 mg | ORAL_TABLET | Freq: Every evening | ORAL | Status: DC | PRN
  Filled 2017-01-08: qty 1

## 2017-01-08 MED ORDER — HYDROXYZINE HCL 50 MG PO TABS
50.0000 mg | ORAL_TABLET | Freq: Four times a day (QID) | ORAL | Status: DC | PRN
  Administered 2017-01-08 – 2017-01-09 (×2): 50 mg via ORAL
  Filled 2017-01-08 (×2): qty 1

## 2017-01-08 MED ORDER — MAGNESIUM HYDROXIDE 400 MG/5ML PO SUSP: 30.0000 mL | Freq: Every day | ORAL | Status: DC | PRN

## 2017-01-08 MED ORDER — LORAZEPAM 2 MG PO TABS
2.0000 mg | ORAL_TABLET | Freq: Once | ORAL | Status: AC
  Administered 2017-01-08: 2 mg via ORAL
  Filled 2017-01-08: qty 1

## 2017-01-08 MED ORDER — ACETAMINOPHEN 325 MG PO TABS
650.0000 mg | ORAL_TABLET | Freq: Four times a day (QID) | ORAL | Status: DC | PRN
  Administered 2017-01-08 – 2017-01-11 (×2): 650 mg via ORAL
  Filled 2017-01-08 (×2): qty 2

## 2017-01-08 MED ORDER — ALUM & MAG HYDROXIDE-SIMETH 200-200-20 MG/5ML PO SUSP: 30.0000 mL | ORAL | Status: DC | PRN

## 2017-01-08 NOTE — BH Assessment (Signed)
Per Dr. Sandria Senterlapas patient meets criteria for inpatient hospitalization.  Writer informed the Press photographerCharge Nurse Marylu Lund(Janet).  Marylu LundJanet will contact me with bed a bed assignment. Writer informed the nurse Morrie Sheldon(Ashley) of the patients disposition.
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
## 2017-01-08 NOTE — ED Notes (Signed)
Pt denies SI/HI at this time.  Pt alert, calm, cooperative, requesting food; diet order placed.  Sitter at bedside.

## 2017-01-08 NOTE — BH Assessment (Signed)
Per Marylu LundJanet Engineer, manufacturing systems(Charge Nurse) [facility]  Writer informed the ER RN Morrie Sheldon(Ashley) of the patient disposition.  The accepting physician is Dr. Ardyth HarpsHernandez.

## 2017-01-08 NOTE — ED Notes (Signed)
Pts sheets changed and pt given clean blankets. Pt also assisted to the toilet w/o incident. Pt also ask for new coke with ice.
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
## 2017-01-08 NOTE — ED Notes (Signed)
Pt's sister visiting at this time; belongings checked in with security.
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
## 2017-01-08 NOTE — ED Notes (Signed)
Sitter no longer at bedside per Dr. Toni Amendlapacs order.

## 2017-01-08 NOTE — Tx Team (Signed)
Initial Treatment Plan 01/08/2017 6:12 PM Alicia Shinerebecca Whitney MVH:[id number]    PATIENT STRESSORS: Financial difficulties Marital or family conflict Medication change or noncompliance Substance abuse   PATIENT STRENGTHS: Capable of independent living Communication skills General fund of knowledge Physical Health   PATIENT IDENTIFIED PROBLEMS:   "I took 10-15 Klonopin so that I could go to sleep."    "today is my youngest son's birthday, my 3 kids were adopted by family members."    Substance abuse (UDS negative)    Increased depression       DISCHARGE CRITERIA:  Ability to meet basic life and health needs Improved stabilization in mood, thinking, and/or behavior Motivation to continue treatment in a less acute level of care Verbal commitment to aftercare and medication compliance Withdrawal symptoms are absent or subacute and managed without 24-hour nursing intervention  PRELIMINARY DISCHARGE PLAN: Outpatient therapy Return to previous living arrangement  PATIENT/FAMILY INVOLVEMENT: This treatment plan has been presented to and reviewed with the patient, Alicia Whitney.  The patient and family have been given the opportunity to ask questions and make suggestions.  Tonye PearsonAmanda N Aleia Larocca, RN 01/08/2017, 6:12 PM

## 2017-01-08 NOTE — ED Notes (Signed)
Attempted to call report; RN taking report on another pt at this time.  Name and ascom number left for RN to call me back.

## 2017-01-09 ENCOUNTER — Encounter: Payer: Self-pay | Admitting: Psychiatry

## 2017-01-09 DIAGNOSIS — F1310 Sedative, hypnotic or anxiolytic abuse, uncomplicated: Secondary | ICD-10-CM

## 2017-01-09 DIAGNOSIS — F1120 Opioid dependence, uncomplicated: Secondary | ICD-10-CM

## 2017-01-09 DIAGNOSIS — F332 Major depressive disorder, recurrent severe without psychotic features: Principal | ICD-10-CM

## 2017-01-09 DIAGNOSIS — F1420 Cocaine dependence, uncomplicated: Secondary | ICD-10-CM

## 2017-01-09 DIAGNOSIS — F4310 Post-traumatic stress disorder, unspecified: Secondary | ICD-10-CM

## 2017-01-09 DIAGNOSIS — F1210 Cannabis abuse, uncomplicated: Secondary | ICD-10-CM

## 2017-01-09 LAB — LIPID PANEL
CHOL/HDL RATIO: 2.7 ratio
CHOLESTEROL: 140 mg/dL (ref 0–200)
HDL: 52 mg/dL (ref >40)
LDL CALC: 75 mg/dL (ref 0–99)
TRIGLYCERIDES: 66 mg/dL (ref <150)
VLDL: 13 mg/dL (ref 0–40)

## 2017-01-09 LAB — TSH: TSH: 0.492 u[IU]/mL (ref 0.350–4.500)

## 2017-01-09 LAB — VITAMIN B12: Vitamin B-12: 318 pg/mL (ref 180–914)

## 2017-01-09 MED ORDER — SERTRALINE HCL 25 MG PO TABS
25.0000 mg | ORAL_TABLET | Freq: Every day | ORAL | Status: DC
  Administered 2017-01-09 – 2017-01-12 (×4): 25 mg via ORAL
  Filled 2017-01-09 (×4): qty 1

## 2017-01-09 MED ORDER — KETOCONAZOLE 2 % EX CREA
TOPICAL_CREAM | Freq: Two times a day (BID) | CUTANEOUS | Status: DC
  Administered 2017-01-09: 15:00:00 via TOPICAL
  Administered 2017-01-10: 1 via TOPICAL
  Administered 2017-01-11 (×2): via TOPICAL
  Filled 2017-01-09: qty 15

## 2017-01-09 MED ORDER — IBUPROFEN 600 MG PO TABS: 600.0000 mg | ORAL_TABLET | Freq: Four times a day (QID) | ORAL | Status: DC | PRN

## 2017-01-09 MED ORDER — ONDANSETRON HCL 4 MG PO TABS
8.0000 mg | ORAL_TABLET | Freq: Three times a day (TID) | ORAL | Status: DC | PRN
  Administered 2017-01-09: 8 mg via ORAL
  Filled 2017-01-09: qty 1

## 2017-01-09 MED ORDER — TRAZODONE HCL 50 MG PO TABS
50.0000 mg | ORAL_TABLET | Freq: Every day | ORAL | Status: DC
  Administered 2017-01-09 – 2017-01-10 (×2): 50 mg via ORAL
  Filled 2017-01-09 (×2): qty 1

## 2017-01-09 MED ORDER — HYDROXYZINE HCL 25 MG PO TABS
25.0000 mg | ORAL_TABLET | Freq: Three times a day (TID) | ORAL | Status: DC
  Administered 2017-01-09 – 2017-01-12 (×10): 25 mg via ORAL
  Filled 2017-01-09 (×12): qty 1

## 2017-01-09 MED ORDER — LOPERAMIDE HCL 2 MG PO CAPS: 4.0000 mg | ORAL_CAPSULE | ORAL | Status: DC | PRN
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
## 2017-01-09 NOTE — Progress Notes (Signed)
Patient stated that she feels dizzy and weak.Patient rated her anxiety 8/10.Denies suicidal or homicidal ideations and AV hallucinations.Patient ambulates in the hallway without any problem and appetite good.Stayed in bed,did not attend all groups.Compliant with medications.Support & encouragement given.

## 2017-01-09 NOTE — [facility] Group Notes (Signed)
[facility] LCSW Group Therapy   01/09/2017  9:30 am   Type of Therapy: Group Therapy   Participation Level: Pt invited but did not attend.  Modes of Intervention: Clarification, Confrontation, Discussion, Education, Exploration, Limit-setting, Orientation, Problem-solving, Rapport Building, Dance movement psychotherapisteality Testing, Socialization and Support   Summary of Progress/Problems: The topic for group today was emotional regulation. This group focused on both positive and negative emotion identification and allowed  group members to process ways to identify feelings, regulate negative emotions, and find healthy ways to manage internal/external emotions. Group members were asked to reflect on a time when their reaction to an emotion led to a negative outcome and explored how alternative responses using emotion regulation would have benefited them. Group members were also asked to discuss a time when emotion regulation was utilized when a negative emotion was experienced.     Hampton AbbotKadijah Cedrica Brune, MSW, LCSWA 01/09/2017, 11:19AM

## 2017-01-09 NOTE — Plan of Care (Signed)
Problem: Safety: Goal: Ability to remain free from injury will improve Outcome: Progressing Pt safe on the unit at this time   

## 2017-01-09 NOTE — Progress Notes (Signed)
Recreation Therapy Notes  Date: 03.07.18 Time: 1:00 pm Location: Craft Room  Group Topic: Communication  Goal Area(s) Addresses:  Patient will participate in communication activity. Patient will verbalize importance of communication.  Behavioral Response: Attentive, Interactive  Intervention: I Am  Activity: Patients were paired up back-to-back and one patient was given a picture and was instructed to tell the other patient what to draw and afterwards they switched roles. Next they could sit side by side and one patient would tell the other patient what to draw and they switched roles.  Education: LRT educated patients on communication.  Education Outcome: Acknowledges education/In group clarification offered   Clinical Observations/Feedback: Patient gave directions to peer and listened to directions from peer. Patient contributed to group discussion.  Jacquelynn CreeGreene,Shanen Norris M, LRT/CTRS 01/09/2017 4:07 PM
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
## 2017-01-09 NOTE — Plan of Care (Signed)
Problem: Activity: Goal: Risk for activity intolerance will decrease Outcome: Not Progressing Patient stayed in room most of the time.

## 2017-01-09 NOTE — Progress Notes (Signed)
D: Pt denies SI/HI/AVH. Pt is dramatic at times. Pt stated she was feeling weak and dizzy. Pt was given Gatorade and informed to notify staff when she needed to ambulate at night. Pt was observed on the unit walking and interacting without issue and appeared to not be suffering from dizzy spells. Pt was informed to notify staff if her condition changed in any way.    A: Pt was offered support and encouragement. Pt was given scheduled medications. Pt was encourage to attend groups. Q 15 minute checks were done for safety.   R: Pt is taking medication. Pt receptive to treatment and safety maintained on unit.

## 2017-01-09 NOTE — Tx Team (Signed)
Interdisciplinary Treatment and Diagnostic Plan Update  01/09/2017 Time of Session: 11:00am Alicia Whitney MRN: [id number]  Principal Diagnosis: Severe recurrent major depression without psychotic features [facility])  Secondary Diagnoses: Principal Problem:   Severe recurrent major depression without psychotic features (HCC) Active Problems:   PTSD (post-traumatic stress disorder)   Cocaine use disorder, moderate, dependence (HCC)   Opioid use disorder, moderate, dependence (HCC)   Cannabis use disorder, mild, abuse   Sedative or hypnotic abuse   Current Medications:  Current Facility-Administered Medications  Medication Dose Route Frequency Provider Last Rate Last Dose  . acetaminophen (TYLENOL) tablet 650 mg  650 mg Oral Q6H PRN Audery Amel, MD   650 mg at 01/08/17 1835  . alum & mag hydroxide-simeth (MAALOX/MYLANTA) 200-200-20 MG/5ML suspension 30 mL  30 mL Oral Q4H PRN Audery Amel, MD      . hydrOXYzine (ATARAX/VISTARIL) tablet 25 mg  25 mg Oral TID Jimmy Footman, MD   25 mg at 01/09/17 1147  . ibuprofen (ADVIL,MOTRIN) tablet 600 mg  600 mg Oral Q6H PRN Jimmy Footman, MD      . ketoconazole (NIZORAL) 2 % cream   Topical BID Jimmy Footman, MD      . loperamide (IMODIUM) capsule 4 mg  4 mg Oral PRN Jimmy Footman, MD      . magnesium hydroxide (MILK OF MAGNESIA) suspension 30 mL  30 mL Oral Daily PRN Audery Amel, MD      . ondansetron [facility]) tablet 8 mg  8 mg Oral Q8H PRN Jimmy Footman, MD      . sertraline (ZOLOFT) tablet 25 mg  25 mg Oral Daily Jimmy Footman, MD   25 mg at 01/09/17 1147  . traZODone (DESYREL) tablet 50 mg  50 mg Oral QHS Jimmy Footman, MD       PTA Medications: Prescriptions Prior to Admission  Medication Sig Dispense Refill Last Dose  . acetaminophen (TYLENOL) 325 MG tablet Take 650 mg by mouth every 6 (six) hours as needed for moderate pain.   prn at prn  .  HYDROcodone-acetaminophen (NORCO/VICODIN) 5-325 MG tablet Take 1 tablet by mouth every 4 (four) hours as needed for moderate pain. (Patient not taking: Reported on 01/08/2017) 8 tablet 0 Not Taking at Unknown time  . ibuprofen (ADVIL,MOTRIN) 800 MG tablet Take 1 tablet (800 mg total) by mouth every 8 (eight) hours as needed. (Patient not taking: Reported on 01/08/2017) 30 tablet 0 Not Taking at Unknown time  . meloxicam (MOBIC) 15 MG tablet Take 1 tablet (15 mg total) by mouth daily. (Patient not taking: Reported on 01/08/2017) 30 tablet 0 Not Taking at Unknown time  . traMADol (ULTRAM) 50 MG tablet Take 1 tablet (50 mg total) by mouth every 6 (six) hours as needed. (Patient not taking: Reported on 01/08/2017) 20 tablet 0 Not Taking at Unknown time    Patient Stressors: Financial difficulties Marital or family conflict Medication change or noncompliance Substance abuse  Patient Strengths: Capable of independent living Wellsite geologist fund of knowledge Physical Health  Treatment Modalities: Medication Management, Group therapy, Case management,  1 to 1 session with clinician, Psychoeducation, Recreational therapy.   Physician Treatment Plan for Primary Diagnosis: Severe recurrent major depression without psychotic features (HCC) Long Term Goal(s): Improvement in symptoms so as ready for discharge Improvement in symptoms so as ready for discharge   Short Term Goals: Ability to disclose and discuss suicidal ideas Compliance with prescribed medications will improve Ability to identify changes in lifestyle to reduce  recurrence of condition will improve Ability to identify triggers associated with substance abuse/mental health issues will improve  Medication Management: Evaluate patient's response, side effects, and tolerance of medication regimen.  Therapeutic Interventions: 1 to 1 sessions, Unit Group sessions and Medication administration.  Evaluation of Outcomes:  Progressing  Physician Treatment Plan for Secondary Diagnosis: Principal Problem:   Severe recurrent major depression without psychotic features (HCC) Active Problems:   PTSD (post-traumatic stress disorder)   Cocaine use disorder, moderate, dependence (HCC)   Opioid use disorder, moderate, dependence (HCC)   Cannabis use disorder, mild, abuse   Sedative or hypnotic abuse  Long Term Goal(s): Improvement in symptoms so as ready for discharge Improvement in symptoms so as ready for discharge   Short Term Goals: Ability to disclose and discuss suicidal ideas Compliance with prescribed medications will improve Ability to identify changes in lifestyle to reduce recurrence of condition will improve Ability to identify triggers associated with substance abuse/mental health issues will improve     Medication Management: Evaluate patient's response, side effects, and tolerance of medication regimen.  Therapeutic Interventions: 1 to 1 sessions, Unit Group sessions and Medication administration.  Evaluation of Outcomes: Progressing   RN Treatment Plan for Primary Diagnosis: Severe recurrent major depression without psychotic features (HCC) Long Term Goal(s): Knowledge of disease and therapeutic regimen to maintain health will improve  Short Term Goals: Ability to identify and develop effective coping behaviors will improve and Compliance with prescribed medications will improve  Medication Management: RN will administer medications as ordered by provider, will assess and evaluate patient's response and provide education to patient for prescribed medication. RN will report any adverse and/or side effects to prescribing provider.  Therapeutic Interventions: 1 on 1 counseling sessions, Psychoeducation, Medication administration, Evaluate responses to treatment, Monitor vital signs and CBGs as ordered, Perform/monitor CIWA, COWS, AIMS and Fall Risk screenings as ordered, Perform wound care  treatments as ordered.  Evaluation of Outcomes: Progressing   LCSW Treatment Plan for Primary Diagnosis: Severe recurrent major depression without psychotic features (HCC) Long Term Goal(s): Safe transition to appropriate next level of care at discharge, Engage patient in therapeutic group addressing interpersonal concerns.  Short Term Goals: Engage patient in aftercare planning with referrals and resources and Facilitate patient progression through stages of change regarding substance use diagnoses and concerns  Therapeutic Interventions: Assess for all discharge needs, 1 to 1 time with Social worker, Explore available resources and support systems, Assess for adequacy in community support network, Educate family and significant other(s) on suicide prevention, Complete Psychosocial Assessment, Interpersonal group therapy.  Evaluation of Outcomes: Progressing   Progress in Treatment: Attending groups: No. Participating in groups: No. Taking medication as prescribed: Yes. Toleration medication: Yes. Family/Significant other contact made: No, will contact:  identified support person when consent is obtained from patient Patient understands diagnosis: Yes. Discussing patient identified problems/goals with staff: Yes. Medical problems stabilized or resolved: Yes. Denies suicidal/homicidal ideation: Yes. Issues/concerns per patient self-inventory: No. Other: n/a  New problem(s) identified: None identified at this time.   New Short Term/Long Term Goal(s): Goal identified by patient:  "Get back on medications and get new coping skills".  Discharge Plan or Barriers: Patient will discharge home with intensive outpatient services for substance/alcohol use  Reason for Continuation of Hospitalization: Anxiety Depression Medication stabilization  Estimated Length of Stay: 3 to 5 days.   Attendees: Patient: Alicia Whitney 01/09/2017 2:13 PM  Physician: Dr. Radene JourneyJayme Cloud, MD  01/09/2017 2:13 PM  Nursing: Shelia Media, RN 01/09/2017  2:13 PM  RN Care Manager: 01/09/2017 2:13 PM  Social Worker: Fredrich BirksAmaris G. Garnette CzechSampson MSW, LCSWA 01/09/2017 2:13 PM  Recreational Therapist: Jacquelynn CreeElizabeth M. Greene, LRT/CTRS 01/09/2017 2:13 PM  Other:  01/09/2017 2:13 PM  Other:  01/09/2017 2:13 PM  Other: 01/09/2017 2:13 PM    Scribe for Treatment Team: Arelia LongestAmaris G Pressley Barsky, LCSWA 01/09/2017 2:13 PM
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
## 2017-01-09 NOTE — Progress Notes (Signed)
Recreation Therapy Notes  At approximately 2:40 pm, LRT attempted assessment. Patient reported she was shaky and nauseous. LRT will attempted assessment tomorrow.  Jacquelynn CreeGreene,Sandee Bernath M, LRT/CTRS 01/09/2017 3:48 PM

## 2017-01-09 NOTE — Progress Notes (Signed)
Pt woke up this morning endorsing nightmares. Pt stated she has been having nightmares since she was 31 yr old. Pt encouraged to talk with the doctor to see if taking something may be in her best interest.

## 2017-01-10 LAB — HEMOGLOBIN A1C
Hgb A1c MFr Bld: 5.1 % (ref 4.8–5.6)
Mean Plasma Glucose: 100 mg/dL

## 2017-01-10 NOTE — [facility] Group Notes (Signed)
[facility] Group Notes:  (Nursing/MHT/Case Management/Adjunct)  Date:  01/10/2017  Time:  1:35 AM  Type of Therapy:  Group Therapy  Participation Level:  Active  Participation Quality:  Appropriate  Affect:  Appropriate  Cognitive:  Appropriate  Insight:  Appropriate  Engagement in Group:  Engaged  Modes of Intervention:  n/a  Summary of Progress/Problems:  Alicia Whitney 01/10/2017, 1:35 AM

## 2017-01-10 NOTE — [facility] Group Notes (Signed)
[facility] Group Notes:  (Nursing/MHT/Case Management/Adjunct)  Date:  01/10/2017  Time:  9:41 PM  Type of Therapy:  Evening Wrap-up Group  Participation Level:  Active  Participation Quality:  Appropriate and Attentive  Affect:  Appropriate  Cognitive:  Alert and Appropriate  Insight:  Appropriate and Good  Engagement in Group:  Engaged  Modes of Intervention:  Discussion  Summary of Progress/Problems:  Tomasita MorrowChelsea Nanta Tayva Easterday 01/10/2017, 9:41 PM

## 2017-01-10 NOTE — [facility] Group Notes (Signed)
[facility] Group Notes:  (Nursing/MHT/Case Management/Adjunct)  Date:  01/10/2017  Time:  4:35 PM  Type of Therapy:  Psychoeducational Skills  Participation Level:  Did Not Attend   Alicia Whitney [name] [name] 01/10/2017, 4:35 PM

## 2017-01-10 NOTE — Plan of Care (Signed)
Problem: Coping: Goal: Ability to cope will improve Outcome: Not Met (add Reason) Patient remains anxious. Limited interaction with peers. Did not attend group. Med compliant. No PRNs given. Med adm w/education. No voiced thoughts of hurting herself. Denies AVH. Safety maintained with q 15 min checks.
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
## 2017-01-10 NOTE — Progress Notes (Signed)
D: Patient stated slept fairlast night .Stated appetite is good and energy level  Is normal. Stated concentration is good . Stated on Depression scale 0 , hopeless 0 and anxiety 5 .( low 0-10 high) Denies suicidal  homicidal ideations  .  No auditory hallucinations  No pain concerns . Appropriate ADL'S. Interacting with peers and staff.  Patient worked on  What I can do  Made 5  I statements  "Alicia MillinWhatch what I say before I speak. A: Encourage patient participation with unit programming . Instruction  Given on  Medication , verbalize understanding. R: Voice no other concerns. Staff continue to monitor

## 2017-01-10 NOTE — [facility] Group Notes (Signed)
Goals Group Date/Time: 01/10/2017 9:00 AM Type of Therapy and Topic: Group Therapy: Goals Group: SMART Goals   Participation Level: Moderate  Description of Group:    The purpose of a daily goals group is to assist and guide patients in setting recovery/wellness-related goals. The objective is to set goals as they relate to the crisis in which they were admitted. Patients will be using SMART goal modalities to set measurable goals. Characteristics of realistic goals will be discussed and patients will be assisted in setting and processing how one will reach their goal. Facilitator will also assist patients in applying interventions and coping skills learned in psycho-education groups to the SMART goal and process how one will achieve defined goal.   Therapeutic Goals:   -Patients will develop and document one goal related to or their crisis in which brought them into treatment.  -Patients will be guided by LCSW using SMART goal setting modality in how to set a measurable, attainable, realistic and time sensitive goal.  -Patients will process barriers in reaching goal.  -Patients will process interventions in how to overcome and successful in reaching goal.   Patient's Goal: PT shared excellent, SMART goals for day: to develop 5 positive "I statements" today and to increase her socialization on the unit.   Therapeutic Modalities:  Motivational Interviewing  Research officer, political partyCognitive Behavioral Therapy  Crisis Intervention Model  SMART goals setting   Daleen SquibbGreg Layna Roeper, [place]CSW

## 2017-01-10 NOTE — Plan of Care (Signed)
Problem: Coping: Goal: Ability to identify and develop effective coping behavior will improve Outcome: Progressing Working on Materials engineercoping  Skills

## 2017-01-10 NOTE — [facility] Suicide Risk Assessment (Signed)
[facility] INPATIENT:  Family/Significant Other Suicide Prevention Education  Suicide Prevention Education:  Education Completed; Danny Tear(friend [phone], has been identified by the patient as the family member/significant other with whom the patient will be residing, and identified as the person(s) who will aid the patient in the event of a mental health crisis (suicidal ideations/suicide attempt).  With written consent from the patient, the family member/significant other has been provided the following suicide prevention education, prior to the and/or following the discharge of the patient.  The suicide prevention education provided includes the following:  Suicide risk factors  Suicide prevention and interventions  National Suicide Hotline telephone number  [facility] assessment telephone number  [facility] [facility] Assistance 911  [facility] and/or Residential Mobile Crisis Unit telephone number  Request made of family/significant other to:  Remove weapons (e.g., guns, rifles, knives), all items previously/currently identified as safety concern.    Remove drugs/medications (over-the-counter, prescriptions, illicit drugs), all items previously/currently identified as a safety concern.  The family member/significant other verbalizes understanding of the suicide prevention education information provided.  The family member/significant other agrees to remove the items of safety concern listed above.  Josaiah Muhammed G. Garnette CzechSampson MSW, LCSWA 01/10/2017, 10:43 AM
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
## 2017-01-11 MED ORDER — HYDROXYZINE HCL 25 MG PO TABS
25.0000 mg | ORAL_TABLET | Freq: Once | ORAL | Status: AC
Start: 2017-01-11 — End: 2017-01-11
  Administered 2017-01-11: 25 mg via ORAL

## 2017-01-11 MED ORDER — SERTRALINE HCL 50 MG PO TABS: 50.0000 mg | ORAL_TABLET | Freq: Every day | ORAL | 0 refills | Status: DC

## 2017-01-11 MED ORDER — TRAZODONE HCL 100 MG PO TABS
100.0000 mg | ORAL_TABLET | Freq: Every day | ORAL | Status: DC
  Administered 2017-01-11: 100 mg via ORAL
  Filled 2017-01-11: qty 1

## 2017-01-11 MED ORDER — HYDROXYZINE HCL 25 MG PO TABS: 25.0000 mg | ORAL_TABLET | Freq: Three times a day (TID) | ORAL | 0 refills | Status: DC | PRN

## 2017-01-11 MED ORDER — KETOCONAZOLE 2 % EX CREA: TOPICAL_CREAM | Freq: Two times a day (BID) | CUTANEOUS | 0 refills | Status: AC

## 2017-01-11 MED ORDER — TRAZODONE HCL 100 MG PO TABS
100.0000 mg | ORAL_TABLET | Freq: Every day | ORAL | 0 refills | Status: DC
Start: 2017-01-11 — End: 2018-11-14
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
## 2017-01-11 NOTE — Progress Notes (Signed)
Alicia JoinerRebecca was cooperative with treatment. She had a good visit friends today. She interacted well with peers and staff on unit. She reports being depressed but feeling better today. She complained of not being able to sleep around midnight. MD called an a one time order given for Hydroxyzine 25 mg x1.

## 2017-01-11 NOTE — Tx Team (Signed)
Interdisciplinary Treatment and Diagnostic Plan Update  01/11/2017 Time of Session: 11:00am Alicia Whitney MRN: [id number]  Principal Diagnosis: Severe recurrent major depression without psychotic features [facility](HCC)  Secondary Diagnoses: Principal Problem:   Severe recurrent major depression without psychotic features (HCC) Active Problems:   PTSD (post-traumatic stress disorder)   Cocaine use disorder, moderate, dependence (HCC)   Opioid use disorder, moderate, dependence (HCC)   Cannabis use disorder, mild, abuse   Sedative or hypnotic abuse   Current Medications:  Current Facility-Administered Medications  Medication Dose Route Frequency Provider Last Rate Last Dose  . acetaminophen (TYLENOL) tablet 650 mg  650 mg Oral Q6H PRN Audery AmelJohn T Clapacs, MD   650 mg at 01/08/17 1835  . alum & mag hydroxide-simeth (MAALOX/MYLANTA) 200-200-20 MG/5ML suspension 30 mL  30 mL Oral Q4H PRN Audery AmelJohn T Clapacs, MD      . hydrOXYzine (ATARAX/VISTARIL) tablet 25 mg  25 mg Oral TID Jimmy FootmanAndrea Hernandez-Gonzalez, MD   25 mg at 01/11/17 0756  . ibuprofen (ADVIL,MOTRIN) tablet 600 mg  600 mg Oral Q6H PRN Jimmy FootmanAndrea Hernandez-Gonzalez, MD      . ketoconazole (NIZORAL) 2 % cream   Topical BID Jimmy FootmanAndrea Hernandez-Gonzalez, MD      . loperamide (IMODIUM) capsule 4 mg  4 mg Oral PRN Jimmy FootmanAndrea Hernandez-Gonzalez, MD      . magnesium hydroxide (MILK OF MAGNESIA) suspension 30 mL  30 mL Oral Daily PRN Audery AmelJohn T Clapacs, MD      . ondansetron [facility](ZOFRAN) tablet 8 mg  8 mg Oral Q8H PRN Jimmy FootmanAndrea Hernandez-Gonzalez, MD   8 mg at 01/09/17 1449  . sertraline (ZOLOFT) tablet 25 mg  25 mg Oral Daily Jimmy FootmanAndrea Hernandez-Gonzalez, MD   25 mg at 01/11/17 0756  . traZODone (DESYREL) tablet 50 mg  50 mg Oral QHS Jimmy FootmanAndrea Hernandez-Gonzalez, MD   50 mg at 01/10/17 2111   PTA Medications: Prescriptions Prior to Admission  Medication Sig Dispense Refill Last Dose  . acetaminophen (TYLENOL) 325 MG tablet Take 650 mg by mouth every 6 (six) hours as needed for  moderate pain.   prn at prn  . HYDROcodone-acetaminophen (NORCO/VICODIN) 5-325 MG tablet Take 1 tablet by mouth every 4 (four) hours as needed for moderate pain. (Patient not taking: Reported on 01/08/2017) 8 tablet 0 Not Taking at Unknown time  . ibuprofen (ADVIL,MOTRIN) 800 MG tablet Take 1 tablet (800 mg total) by mouth every 8 (eight) hours as needed. (Patient not taking: Reported on 01/08/2017) 30 tablet 0 Not Taking at Unknown time  . meloxicam (MOBIC) 15 MG tablet Take 1 tablet (15 mg total) by mouth daily. (Patient not taking: Reported on 01/08/2017) 30 tablet 0 Not Taking at Unknown time  . traMADol (ULTRAM) 50 MG tablet Take 1 tablet (50 mg total) by mouth every 6 (six) hours as needed. (Patient not taking: Reported on 01/08/2017) 20 tablet 0 Not Taking at Unknown time    Patient Stressors: Financial difficulties Marital or family conflict Medication change or noncompliance Substance abuse  Patient Strengths: Capable of independent living Wellsite geologistCommunication skills General fund of knowledge Physical Health  Treatment Modalities: Medication Management, Group therapy, Case management,  1 to 1 session with clinician, Psychoeducation, Recreational therapy.   Physician Treatment Plan for Primary Diagnosis: Severe recurrent major depression without psychotic features (HCC) Long Term Goal(s): Improvement in symptoms so as ready for discharge Improvement in symptoms so as ready for discharge   Short Term Goals: Ability to disclose and discuss suicidal ideas Compliance with prescribed medications will improve Ability to  identify changes in lifestyle to reduce recurrence of condition will improve Ability to identify triggers associated with substance abuse/mental health issues will improve  Medication Management: Evaluate patient's response, side effects, and tolerance of medication regimen.  Therapeutic Interventions: 1 to 1 sessions, Unit Group sessions and Medication  administration.  Evaluation of Outcomes: Progressing  Physician Treatment Plan for Secondary Diagnosis: Principal Problem:   Severe recurrent major depression without psychotic features (HCC) Active Problems:   PTSD (post-traumatic stress disorder)   Cocaine use disorder, moderate, dependence (HCC)   Opioid use disorder, moderate, dependence (HCC)   Cannabis use disorder, mild, abuse   Sedative or hypnotic abuse  Long Term Goal(s): Improvement in symptoms so as ready for discharge Improvement in symptoms so as ready for discharge   Short Term Goals: Ability to disclose and discuss suicidal ideas Compliance with prescribed medications will improve Ability to identify changes in lifestyle to reduce recurrence of condition will improve Ability to identify triggers associated with substance abuse/mental health issues will improve     Medication Management: Evaluate patient's response, side effects, and tolerance of medication regimen.  Therapeutic Interventions: 1 to 1 sessions, Unit Group sessions and Medication administration.  Evaluation of Outcomes: Progressing   RN Treatment Plan for Primary Diagnosis: Severe recurrent major depression without psychotic features (HCC) Long Term Goal(s): Knowledge of disease and therapeutic regimen to maintain health will improve  Short Term Goals: Ability to identify and develop effective coping behaviors will improve and Compliance with prescribed medications will improve  Medication Management: RN will administer medications as ordered by provider, will assess and evaluate patient's response and provide education to patient for prescribed medication. RN will report any adverse and/or side effects to prescribing provider.  Therapeutic Interventions: 1 on 1 counseling sessions, Psychoeducation, Medication administration, Evaluate responses to treatment, Monitor vital signs and CBGs as ordered, Perform/monitor CIWA, COWS, AIMS and Fall Risk  screenings as ordered, Perform wound care treatments as ordered.  Evaluation of Outcomes: Progressing   LCSW Treatment Plan for Primary Diagnosis: Severe recurrent major depression without psychotic features (HCC) Long Term Goal(s): Safe transition to appropriate next level of care at discharge, Engage patient in therapeutic group addressing interpersonal concerns.  Short Term Goals: Engage patient in aftercare planning with referrals and resources and Facilitate patient progression through stages of change regarding substance use diagnoses and concerns  Therapeutic Interventions: Assess for all discharge needs, 1 to 1 time with Social worker, Explore available resources and support systems, Assess for adequacy in community support network, Educate family and significant other(s) on suicide prevention, Complete Psychosocial Assessment, Interpersonal group therapy.  Evaluation of Outcomes: Progressing    Recreational Therapy Treatment Plan for Primary Diagnosis: Severe recurrent major depression without psychotic features (HCC) Long Term Goal(s): Patient will participate in recreation therapy treatment in at least 2 group sessions without prompting from LRT  Short Term Goals: Increase self-esteem, Increase stress management skills  Treatment Modalities: Group Therapy and Individual Treatment Sessions  Therapeutic Interventions: Psychoeducation  Evaluation of Outcomes: Progressing   Progress in Treatment: Attending groups: Yes. Participating in groups: Yes. Taking medication as prescribed: Yes. Toleration medication: Yes. Family/Significant other contact made: Yes, individual(s) contacted:  friend, Danny Tear Patient understands diagnosis: Yes. Discussing patient identified problems/goals with staff: Yes. Medical problems stabilized or resolved: Yes. Denies suicidal/homicidal ideation: Yes. Issues/concerns per patient self-inventory: No. Other: n/a  New problem(s) identified:  None identified at this time.   New Short Term/Long Term Goal(s): Goal identified by patient:  "Get back on  medications and get new coping skills".  Discharge Plan or Barriers:  Patient will discharge home with intensive outpatient services for substance/alcohol use.  Reason for Continuation of Hospitalization: Anxiety Depression Medication stabilization  Estimated Length of Stay: 1-2 days.   Attendees: Patient: Alicia Whitney 01/11/2017 8:47 AM  Physician: Dr. Radene JourneyJayme Cloud, MD 01/11/2017 8:47 AM  Nursing: Leonia Reader, RN 01/11/2017 8:47 AM  RN Care Manager: 01/11/2017 8:47 AM  Social Worker: Fredrich Birks. Garnette Czech MSW, LCSWA 01/11/2017 8:47 AM  Recreational Therapist: Jacquelynn Cree, LRT/CTRS 01/11/2017 8:47 AM  Other:  01/11/2017 8:47 AM  Other:  01/11/2017 8:47 AM  Other: 01/11/2017 8:47 AM    Scribe for Treatment Team: Arelia Longest, LCSWA 01/11/2017 11:54 AM
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
## 2017-01-11 NOTE — Plan of Care (Signed)
Problem: [facility] Participation in Recreation Therapeutic Interventions Goal: STG-Patient will demonstrate improved self esteem by identif STG: Self-Esteem - Within 3 treatment sessions, patient will verbalize at least 5 positive affirmation statements in one treatment session to increase self-esteem.  Outcome: Completed/Met Date Met: 01/11/17 Treatment Session 1; Completed 1 out of 1: At approximately 3:25 pm, LRT met with patient in consultation room. Patient verbalized 5 positive affirmation statements. Patient reported it felt "good". LRT encouraged patient to continue saying positive affirmation statements.  Leonette Monarch, LRT/CRTS 03.09.18 4:28 pm Goal: STG-Other Recreation Therapy Goal (Specify) STG: Stress Management - Within 3 treatment sessions, patient will verbalize understanding of the stress management techniques in one treatment session to increase stress management skills.  Outcome: Completed/Met Date Met: 01/11/17 Treatment Session 1; Completed 1 out of 1: At approximately 3:25 pm, LRT met with patient in consultation room. LRT educated and provided patient with handouts on stress management techniques. Patient verbalized understanding. LRT encouraged patient to read over and practice the stress management techniques.  Leonette Monarch, LRT/CTRS 03.09.18 4:29 pm

## 2017-01-11 NOTE — [facility] Group Notes (Signed)
[facility] LCSW Group Therapy Note  Date/Time: 01/11/17, 0930  Type of Therapy and Topic:  Group Therapy:  Feelings around Relapse and Recovery  Participation Level:  Active   Mood: upbeat  Description of Group:    Patients in this group will discuss emotions they experience before and after a relapse. They will process how experiencing these feelings, or avoidance of experiencing them, relates to having a relapse. Facilitator will guide patients to explore emotions they have related to recovery. Patients will be encouraged to process which emotions are more powerful. They will be guided to discuss the emotional reaction significant others in their lives may have to patients' relapse or recovery. Patients will be assisted in exploring ways to respond to the emotions of others without this contributing to a relapse.  Therapeutic Goals: 1. Patient will identify two or more emotions that lead to relapse for them:  2. Patient will identify two emotions that result when they relapse:  3. Patient will identify two emotions related to recovery:  4. Patient will demonstrate ability to communicate their needs through discussion and/or role plays.   Summary of Patient Progress: Pt identified feelings that lead to relapse for her: overwhelmed, ignored.  Pt shared with the group that her current admission is related to both a mental health and a substance use relapse that came from dealing with those feelings.  Pt also gave appropriate feedback to other group members on several occasions.     Therapeutic Modalities:   Cognitive Behavioral Therapy Solution-Focused Therapy Assertiveness Training Relapse Prevention Therapy  Daleen SquibbGreg Omar Gayden, LCSW
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
## 2017-01-11 NOTE — Progress Notes (Signed)
Pleasant and cooperative with care. Med and group compliant. Denies SI, HI, AVH. Pt to be discharged on tomorrow. No complaints. Encouragement and support offered. Safety checks maintained. Pt receptive and remains safe on unit with q 15 min checks.

## 2017-01-11 NOTE — Progress Notes (Signed)
Recreation Therapy Notes  Date: 03.09.18 Time: 1:00 pm Location: Craft Room  Group Topic: Social Skills  Goal Area(s) Addresses:  Patient will effectively work with peer towards shared goal. Patient will identify skills used to make activity successful. Patient will identify benefit of using group skills effectively post d/c.  Behavioral Response: Attentive, Interactive  Intervention: [employer] Tower  Activity: Patients were put in groups and given 15 pipe cleaners. Patients were instructed to build a free standing tower with all 15 pipe cleaners. Patients were given 2 minutes to strategize. After about 5 minutes of building, patient were instructed to put their dominant hand behind their backs. After about another 5 minutes of building, patients were instructed to stop talking to each other.  Education: LRT educated patients on healthy support systems.  Education Outcome: Acknowledges education/In group clarification offered   Clinical Observations/Feedback: Patient worked with peers to build tower. Patient used effective communication, problem solving, and teamwork. Patient contributed to group discussion by stating why communication, problem solving, and teamwork are important, and what would change for her if she started using these skills post d/c.  Jacquelynn CreeGreene,Pasha Broad M, LRT/CTRS 01/11/2017 1:57 PM

## 2017-01-11 NOTE — Progress Notes (Signed)
Recreation Therapy Notes  (Late Entry for 03.08.18)  Date: 03.08.18 Time: 1:00 pm Location: Craft Room  Group Topic: Leisure Education  Goal Area(s) Addresses:  Patient will identify activities for each letter of the alphabet. Patient will verbalize ability to integrate positive leisure into life post d/c. Patient will verbalized ability to use leisure as a Associate Professorcoping skill.  Behavioral Response: Attentive, Interactive  Intervention: Leisure Alphabet  Activity: Patients were given a Leisure Information systems managerAlphabet worksheet and were instructed to write healthy leisure activities for each letter of the alphabet.  Education: LRT educated patients on what they need to participate in leisure.  Education Outcome: Acknowledges education/In group clarification offered  Clinical Observations/Feedback: Patient wrote healthy leisure activities. Patient contributed to group discussion by stating some of her healthy leisure activities.  Jacquelynn CreeGreene,Meko Bellanger M, LRT/CTRS 01/11/2017 8:13 AM

## 2017-01-11 NOTE — Plan of Care (Signed)
Problem: Pain Managment: Goal: General experience of comfort will improve Outcome: Progressing Denies pain and discomfort

## 2017-01-12 MED ORDER — KETOCONAZOLE 2 % EX CREA
TOPICAL_CREAM | Freq: Two times a day (BID) | CUTANEOUS | Status: DC
  Filled 2017-01-12: qty 15

## 2017-01-12 NOTE — Progress Notes (Signed)
  [facility] :  Will you be returning to the same living situation after discharge:  Yes,  home with boyfriend At discharge, do you have transportation home?: Yes,  family Do you have the ability to pay for your medications: Yes, patient has financial assistance from boyfriend.   Release of information consent forms completed and in the chart;  Patient's signature needed at discharge.  Patient to Follow up at: Follow-up Information    [facility] Follow up on 01/14/2017.   Why:  7:15am appointment on this date with [facility] for intensive outpatient services for medication managment, therapy, and substance use treatment. Contact Mal MistyHarvery Bryant Peer support specialist [phone]) with any questions.  Contact information: [address] [place] [id number] [phone]           Next level of care provider [facility] Link:no  Safety Planning and Suicide Prevention discussed: Yes,  with patient and friend.   Have you used any form of tobacco in the last 30 days? (Cigarettes, Smokeless Tobacco, Cigars, and/or Pipes): No  Has patient been referred to the Quitline?: Patient refused referral  Patient has been referred for addiction treatment: Yes  Keron Neenan G. Garnette CzechSampson MSW, LCSWA 01/12/2017, 10:27 AM

## 2017-01-12 NOTE — Progress Notes (Signed)
Pt denies SI/HI/AVH. Attended evening group. Visible in milieu interacting appropriately with peers and staff. Denies pain. Medication compliant and voices no concerns at this time. Encouragement and support provided. Safety maintained. Will continue to monitor 

## 2017-01-12 NOTE — [facility] Group Notes (Signed)
[facility] LCSW Group Therapy  01/12/2017 2:37 PM  Type of Therapy:  Group Therapy  Participation Level:  Active  Participation Quality:  Appropriate  Affect:  Appropriate  Cognitive:  Appropriate  Insight:  Engaged  Engagement in Therapy:  Engaged  Modes of Intervention:  Activity, Discussion, Education, Problem-solving, Dance movement psychotherapisteality Testing and Support  Summary of Progress/Problems: Goal Setting: The objective is to set goals as they relate to the crisis in which they were admitted. Patients will be using SMART goal modalities to set measurable goals. Characteristics of realistic goals will be discussed and patients will be assisted in setting and processing how one will reach their goal. Facilitator will also assist patients in applying interventions and coping skills learned in psycho-education groups to the SMART goal and process how one will achieve defined goal.  Alicia Whitney G. Garnette CzechSampson MSW, LCSWA 01/12/2017, 2:37 PM

## 2017-01-12 NOTE — Progress Notes (Addendum)
D: Patient is aware of  Discharge this shift .Patient denies suicidal /homicidal ideations. Patient received all belongings brought in  A: No Storage medications. Writer reviewed Discharge Summary, Suicide Risk Assessment, and Transitional Record. Patient also received Prescriptions   from  MD. A 7 day supply of medications given to patient A: Writer instructed on discharge criteria  . Marland Kitchen. Aware  Of follow up appointment . R: Patient left unit with no questions  Or concerns  With father.

## 2017-01-12 NOTE — [facility] Group Notes (Signed)
[facility] Group Notes:  (Nursing/MHT/Case Management/Adjunct)  Date:  01/12/2017  Time:  2:26 AM  Type of Therapy:  Psychoeducational Skills  Participation Level:  Active  Participation Quality:  Appropriate, Attentive and Sharing  Affect:  Appropriate  Cognitive:  Appropriate  Insight:  Appropriate and Good  Engagement in Group:  Engaged  Modes of Intervention:  Discussion, Socialization and Support  Summary of Progress/Problems:  Alicia MilroyLaquanda Y Janny Crute 01/12/2017, 2:26 AM

## 2017-01-14 NOTE — Progress Notes (Signed)
Recreation Therapy Notes  INPATIENT RECREATION TR PLAN  Patient Details Name: Alicia Whitney MRN: [id number] DOB: [birth year] Today's Date: 01/14/2017  Rec Therapy Plan Is patient appropriate for Therapeutic Recreation?: Yes Treatment times per week: At least once a week TR Treatment/Interventions: 1:1 session, Group participation (Comment) (Appropriate participation in daily recreational therapy tx)  Discharge Criteria Pt will be discharged from therapy if:: Treatment goals are met, Discharged Treatment plan/goals/alternatives discussed and agreed upon by:: Patient/family  Discharge Summary Short term goals set: See Care Plan Short term goals met: Complete Progress toward goals comments: One-to-one attended Which groups?: Social skills, Leisure education, Communication One-to-one attended: Self-esteem, stress management Reason goals not met: N/A Therapeutic equipment acquired: None Reason patient discharged from therapy: Discharge from hospital Pt/family agrees with progress & goals achieved: Yes Date patient discharged from therapy: 01/12/17   Leonette Monarch, LRT/CTRS 01/14/2017, 4:33 PM

## 2017-02-07 ENCOUNTER — Encounter (INDEPENDENT_AMBULATORY_CARE_PROVIDER_SITE_OTHER): Payer: Self-pay

## 2017-02-07 ENCOUNTER — Ambulatory Visit: Payer: No Typology Code available for payment source | Admitting: Pharmacy Technician

## 2017-02-07 DIAGNOSIS — Z79899 Other long term (current) drug therapy: Secondary | ICD-10-CM

## 2017-02-07 NOTE — Progress Notes (Signed)
Met with patient completed financial assistance application for  due to recent hospital visit.  Patient agreed to be responsible for gathering financial information and forwarding to appropriate department in [employer]    Completed [facility] application and contract.  Patient agreed to all terms of the [facility] contract.  Patient to provide notarized letter of support and utility bill.  Provided patient with community resource material based on her particular needs.    [place] [facility]

## 2017-03-18 ENCOUNTER — Telehealth: Payer: Self-pay | Admitting: Pharmacy Technician

## 2017-03-18 NOTE — Telephone Encounter (Signed)
Patient failed to provide proof of income for 2018.  [facility] unable to provide additional [facility] until eligibility is determined.  Alicia DacostaBetty J. Alicia Whitney Care Manager [facility]

## 2018-10-14 ENCOUNTER — Other Ambulatory Visit: Payer: Self-pay

## 2018-10-14 ENCOUNTER — Encounter: Payer: Self-pay | Admitting: Emergency Medicine

## 2018-10-14 ENCOUNTER — Emergency Department: Payer: Self-pay

## 2018-10-14 ENCOUNTER — Emergency Department
Admission: EM | Admit: 2018-10-14 | Discharge: 2018-10-14 | Disposition: A | Discharge status: Home / Self Care | Payer: Self-pay | Attending: Emergency Medicine | Admitting: Emergency Medicine

## 2018-10-14 DIAGNOSIS — Z87891 Personal history of nicotine dependence: Secondary | ICD-10-CM | POA: Insufficient documentation

## 2018-10-14 DIAGNOSIS — R079 Chest pain, unspecified: Secondary | ICD-10-CM | POA: Insufficient documentation

## 2018-10-14 DIAGNOSIS — Z91040 Latex allergy status: Secondary | ICD-10-CM | POA: Insufficient documentation

## 2018-10-14 DIAGNOSIS — R0602 Shortness of breath: Secondary | ICD-10-CM | POA: Insufficient documentation

## 2018-10-14 DIAGNOSIS — R918 Other nonspecific abnormal finding of lung field: Secondary | ICD-10-CM | POA: Insufficient documentation

## 2018-10-14 DIAGNOSIS — Z79899 Other long term (current) drug therapy: Secondary | ICD-10-CM | POA: Insufficient documentation

## 2018-10-14 LAB — BASIC METABOLIC PANEL
Anion gap: 6 (ref 5–15)
BUN: 10 mg/dL (ref 6–20)
CHLORIDE: 107 mmol/L (ref 98–111)
CO2: 25 mmol/L (ref 22–32)
Calcium: 9.1 mg/dL (ref 8.9–10.3)
Creatinine, Ser: 0.8 mg/dL (ref 0.44–1.00)
GFR calc non Af Amer: >60 mL/min (ref >60)
Glucose, Bld: 109 mg/dL — ABNORMAL HIGH (ref 70–99)
POTASSIUM: 3.4 mmol/L — AB (ref 3.5–5.1)
Sodium: 138 mmol/L (ref 135–145)

## 2018-10-14 LAB — HEPATIC FUNCTION PANEL
ALT: 15 U/L (ref 0–44)
AST: 26 U/L (ref 15–41)
Albumin: 3.8 g/dL (ref 3.5–5.0)
Alkaline Phosphatase: 86 U/L (ref 38–126)
Bilirubin, Direct: 0.3 mg/dL — ABNORMAL HIGH (ref 0.0–0.2)
Indirect Bilirubin: 0.6 mg/dL (ref 0.3–0.9)
Total Bilirubin: 0.9 mg/dL (ref 0.3–1.2)
Total Protein: 6.8 g/dL (ref 6.5–8.1)

## 2018-10-14 LAB — CBC
HCT: 41.5 % (ref 36.0–46.0)
Hemoglobin: 13.7 g/dL (ref 12.0–15.0)
MCH: 29.7 pg (ref 26.0–34.0)
MCHC: 33 g/dL (ref 30.0–36.0)
MCV: 90 fL (ref 80.0–100.0)
Platelets: 284 K/uL (ref 150–400)
RBC: 4.61 MIL/uL (ref 3.87–5.11)
RDW: 11.9 % (ref 11.5–15.5)
WBC: 8.7 K/uL (ref 4.0–10.5)
nRBC: 0 % (ref 0.0–0.2)

## 2018-10-14 LAB — LIPASE, BLOOD: Lipase: 35 U/L (ref 11–51)

## 2018-10-14 LAB — FIBRIN DERIVATIVES D-DIMER (ARMC ONLY): FIBRIN DERIVATIVES D-DIMER (ARMC): 601.9 ng{FEU}/mL — AB (ref 0.00–499.00)

## 2018-10-14 LAB — TROPONIN I
Troponin I: <0.03 ng/mL
Troponin I: <0.03 ng/mL

## 2018-10-14 MED ORDER — MORPHINE SULFATE (PF) 4 MG/ML IV SOLN
4.0000 mg | Freq: Once | INTRAVENOUS | Status: AC
  Administered 2018-10-14: 4 mg via INTRAVENOUS
  Filled 2018-10-14: qty 1

## 2018-10-14 MED ORDER — IOHEXOL 350 MG/ML SOLN
75.0000 mL | Freq: Once | INTRAVENOUS | Status: AC | PRN
  Administered 2018-10-14: 75 mL via INTRAVENOUS

## 2018-10-14 MED ORDER — HYDROCODONE-ACETAMINOPHEN 5-325 MG PO TABS: 1.0000 | ORAL_TABLET | ORAL | 0 refills | Status: DC | PRN

## 2018-10-14 MED ORDER — LORAZEPAM 1 MG PO TABS
1.0000 mg | ORAL_TABLET | Freq: Once | ORAL | Status: AC
Start: 2018-10-14 — End: 2018-10-14
  Administered 2018-10-14: 1 mg via ORAL
  Filled 2018-10-14: qty 1

## 2018-10-14 MED ORDER — ONDANSETRON HCL 4 MG/2ML IJ SOLN
4.0000 mg | Freq: Once | INTRAMUSCULAR | Status: AC
  Administered 2018-10-14: 4 mg via INTRAVENOUS
  Filled 2018-10-14: qty 2

## 2018-10-14 NOTE — ED Notes (Signed)
Patient transported to CT 

## 2018-10-14 NOTE — ED Notes (Signed)
Patient states she is having increased pain.  Patient reassessed at this time.  Will re-check patient's vital signs.

## 2018-10-14 NOTE — ED Provider Notes (Signed)
[facility] Emergency Department Provider Note  Time seen: 7:11 PM  I have reviewed the triage vital signs and the nursing notes.   HISTORY  Chief Complaint Abdominal Pain; Chest Pain; and Shortness of Breath    HPI Alicia Whitney is a 32 y.o. female with a past medical history of anxiety, presents to the emergency department for chest pain.  According to the patient at approximately 2 PM today she developed pain in the center of her chest, somewhat worse with deep inspiration.  States mild shortness of breath.  No diaphoresis, no nausea.  Patient also states that time she felt a sharp pain in her upper abdomen although denies any currently.  Patient states no history of chest pain in the past.  No leg pain or swelling.  No use of estrogen or birth control.  Patient has a significant history of anxiety, but states she was not feeling anxious when her symptoms started.  Patient is moderately anxious during my examination.   Past Medical History:  Diagnosis Date  . Anxiety   . Bipolar disorder [facility](HCC)     Patient Active Problem List   Diagnosis Date Noted  . PTSD (post-traumatic stress disorder) 01/09/2017  . Cocaine use disorder, moderate, dependence (HCC) 01/09/2017  . Opioid use disorder, moderate, dependence (HCC) 01/09/2017  . Cannabis use disorder, mild, abuse 01/09/2017  . 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 Severe recurrent major depression without psychotic features (HCC) 01/08/2017    Past Surgical History:  Procedure Laterality Date  . CESAREAN SECTION    . DILATION AND CURETTAGE OF UTERUS    . TONSILLECTOMY    . TUBAL LIGATION      Prior to Admission medications   Medication Sig Start Date End Date Taking? Authorizing Provider  hydrOXYzine (ATARAX/VISTARIL) 25 MG tablet Take 1 tablet (25 mg total) by mouth every 8 (eight) hours as needed. 01/11/17   Jimmy FootmanHernandez-Gonzalez, Andrea, MD  sertraline (ZOLOFT) 50 MG tablet Take 1 tablet (50  mg total) by mouth at bedtime. 01/11/17   Jimmy FootmanHernandez-Gonzalez, Andrea, MD  traZODone (DESYREL) 100 MG tablet Take 1 tablet (100 mg total) by mouth at bedtime. 01/11/17   Jimmy FootmanHernandez-Gonzalez, Andrea, MD    Allergies  Allergen Reactions  . Onion   . Latex Rash    No family history on file.  Social History Social History   Tobacco Use  . Smoking status: Former Games developermoker  . Smokeless tobacco: Never Used  Substance Use Topics  . Alcohol use: Yes    Comment: 2x weekly, 1-2 drinks  . Drug use: Yes    Types: Oxycodone, Cocaine, Marijuana    Review of Systems Constitutional: Negative for fever. Cardiovascular: Central chest pain, moderate earlier, mild currently.  Pressure sensation. Respiratory: Negative for shortness of breath. Gastrointestinal: Sharp upper abdominal pain which is since resolved Genitourinary: Negative for urinary compaints Musculoskeletal: Negative for musculoskeletal complaints.  Negative for leg pain or swelling. Skin: Negative for skin complaints  Neurological: Negative for headache All other ROS negative  ____________________________________________   PHYSICAL EXAM:  VITAL SIGNS: ED Triage Vitals  Enc Vitals Group     BP 10/14/18 1543 122/75     Pulse Rate 10/14/18 1543 (!) 119     Resp 10/14/18 1543 (!) 22     Temp 10/14/18 1544 97.8 F (36.6 C)     Temp Source 10/14/18 1543 Oral     SpO2 10/14/18 1543 100 %     Weight 10/14/18 1545 170 lb (77.1  kg)     Height 10/14/18 1545 5\' 6"  (1.676 m)     Head Circumference --      Peak Flow --      Pain Score 10/14/18 1545 9     Pain Loc --      Pain Edu? --      Excl. in GC? --    Constitutional: Alert and oriented.  Mildly anxious currently. Eyes: Normal exam ENT   Head: Normocephalic and atraumatic.   Mouth/Throat: Mucous membranes are moist. Cardiovascular: Normal rate, regular rhythm around 100 bpm.  No murmur. Respiratory: Normal respiratory effort without tachypnea nor retractions. Breath  sounds are clea Gastrointestinal: Soft and nontender. No distention.  Musculoskeletal: Nontender with normal range of motion in all extremities. No lower extremity tenderness or edema. Neurologic:  Normal speech and language. No gross focal neurologic deficits  Skin:  Skin is warm, dry and intact.  Psychiatric: Mood and affect are normal. Speech and behavior are normal.   ____________________________________________    EKG  EKG viewed and interpreted by myself shows sinus tachycardia at 121 bpm with a narrow QRS, normal axis, normal intervals, no concerning ST changes.  ____________________________________________    RADIOLOGY  Chest x-ray shows mild right middle lobe atelectatic changes.  ____________________________________________   INITIAL IMPRESSION / ASSESSMENT AND PLAN / ED COURSE  Pertinent labs & imaging results that were available during my care of the patient were reviewed by me and considered in my medical decision making (see chart for details).  Patient presents to the emergency department for chest pain starting this afternoon.  Continues to have mild chest pain currently.  Patient is moderately anxious during my evaluation.  States a strong history of anxiety but was not feeling anxious when her symptoms started.  Differential at this time would include anxiety, ACS, PE, pneumonia, pneumothorax.  Patient's initial lab work is reassuring including a negative troponin.  Chest x-ray is clear, EKG shows tachycardia but otherwise no concerning findings.  Patient has no history of DVT no hormonal use, no leg pain or swelling.  However given her pleuritic pain we will add on a d-dimer.  We will also check a repeat troponin as well as a lipase and hepatic function panel.  Patient agreeable to plan of care.  We will treat with a small dose of Ativan as well while awaiting results to see if it helps with the patient's symptoms.  D-dimer is elevated, CT scan was ordered.  CT is  resulted negative for PE however does show a nodular lesion in the left lower lungs 1.4 x 1.8 cm.  They recommend PET/CT for further evaluation.  Patient has no personal cancer history of he does have a family history of lung and breast cancer per patient.  Patient quit smoking at the age of 55.  I discussed the patient with Dr. Orlie Dakin of oncology they will call the patient tomorrow to arrange for follow-up appointment for further work-up.  I discussed this with the patient she is agreeable to plan of care.  Patient is feeling much better after pain medication we will discharge with a short course of pain medication as well.  ____________________________________________   FINAL CLINICAL IMPRESSION(S) / ED DIAGNOSES  Chest pain    Minna Antis, MD 10/14/18 2156

## 2018-10-14 NOTE — ED Notes (Signed)
Pt brought food by husband and eating at this time.

## 2018-10-14 NOTE — ED Triage Notes (Addendum)
Pt arrived with complaints of sudden onset of chest pain and shortness of breath that started 30 minutes prior to arrival. Pt very anxious in triage. Pt states the pain feels like pressure and is in the left 7 middle of her chest. Pt states the pain is worse with deep breaths and palpation. Pt's lungs clear on auscultation. Pt also reports stabbing lower right and left abdominal pain that started this morning. Pt denies n/v/d.

## 2018-10-15 ENCOUNTER — Telehealth: Payer: Self-pay | Admitting: Oncology

## 2018-10-15 NOTE — Telephone Encounter (Signed)
Per Dr. Orlie DakinFinnegan, received a ED referral for pt. Dx: Pulmonary nodules. Left pt VM to contact me @ [phone] to schedule appt ASAP.

## 2018-10-17 ENCOUNTER — Inpatient Hospital Stay: Payer: Self-pay | Attending: Oncology | Admitting: Oncology

## 2018-10-17 ENCOUNTER — Encounter: Payer: Self-pay | Admitting: Oncology

## 2018-10-17 ENCOUNTER — Other Ambulatory Visit: Payer: Self-pay

## 2018-10-17 VITALS — BP 104/73 | HR 88 | Temp 97.4°F | Resp 18 | Wt 181.2 lb

## 2018-10-17 DIAGNOSIS — R079 Chest pain, unspecified: Secondary | ICD-10-CM

## 2018-10-17 DIAGNOSIS — R0602 Shortness of breath: Secondary | ICD-10-CM

## 2018-10-17 DIAGNOSIS — Z87891 Personal history of nicotine dependence: Secondary | ICD-10-CM

## 2018-10-17 DIAGNOSIS — F419 Anxiety disorder, unspecified: Secondary | ICD-10-CM

## 2018-10-17 DIAGNOSIS — R911 Solitary pulmonary nodule: Secondary | ICD-10-CM

## 2018-10-17 DIAGNOSIS — R918 Other nonspecific abnormal finding of lung field: Secondary | ICD-10-CM

## 2018-10-17 DIAGNOSIS — Z79899 Other long term (current) drug therapy: Secondary | ICD-10-CM

## 2018-10-17 NOTE — Progress Notes (Signed)
Patient here today for initial evaluation for lung nodule, referred by the ER. Patient reports continued chest pain and shortness of breath.

## 2018-10-18 NOTE — Progress Notes (Signed)
[facility]  Telephone:(336) [phone] Fax:([phone]  ID: Alicia Whitney OB: January 15, 1986  MR#: [id number]  AOZ#:[id number]  Patient Care Team: Patient, No Pcp Per as PCP - General (General Practice)  CHIEF COMPLAINT: Pulmonary nodules.  INTERVAL HISTORY: Patient is a 32 year old female who recently presented to the emergency room with increasing shortness of breath and chest pain.  Work-up did not reveal distinct etiology of her symptoms, but she was noted to have left lower lobe pulmonary nodules suspicious for malignancy.  She continues to have chest pain shortness of breath, but otherwise feels well.  She has no neurologic complaints.  She denies any recent fevers or illnesses.  She has a good appetite and denies weight loss.  She has no cough or hemoptysis.  She denies any nausea, vomiting, constipation, or diarrhea.  She has no urinary complaints.  Patient otherwise feels well and offers no further specific complaints today.  REVIEW OF SYSTEMS:   Review of Systems  Constitutional: Negative.  Negative for fever and malaise/fatigue.  Respiratory: Positive for shortness of breath. Negative for cough and hemoptysis.   Cardiovascular: Positive for chest pain. Negative for leg swelling.  Gastrointestinal: Negative.  Negative for abdominal pain, blood in stool and melena.  Genitourinary: Negative.  Negative for dysuria.  Musculoskeletal: Negative.  Negative for back pain.  Skin: Negative.  Negative for rash.  Neurological: Negative.  Negative for focal weakness, weakness and headaches.  Psychiatric/Behavioral: The patient is nervous/anxious.     As per HPI. Otherwise, a complete review of systems is negative.  PAST MEDICAL HISTORY: Past Medical History:  Diagnosis Date  . Anxiety   . Bipolar disorder (HCC)     PAST SURGICAL HISTORY: Past Surgical History:  Procedure Laterality Date  . CESAREAN SECTION    . DILATION AND CURETTAGE OF UTERUS    . TONSILLECTOMY     . TUBAL LIGATION      FAMILY HISTORY: History reviewed. No pertinent family history.  ADVANCED DIRECTIVES (Y/N):  N  HEALTH MAINTENANCE: Social History   Tobacco Use  . Smoking status: Former Games developer  . Smokeless tobacco: Never Used  Substance Use Topics  . Alcohol use: Yes    Comment: 2x weekly, 1-2 drinks  . Drug use: Yes    Types: Oxycodone, Cocaine, Marijuana     Colonoscopy:  PAP:  Bone density:  Lipid panel:  Allergies  Allergen Reactions  . Onion   . Latex Rash    Current Outpatient Medications  Medication Sig Dispense Refill  . HYDROcodone-acetaminophen (NORCO/VICODIN) 5-325 MG tablet Take 1 tablet by mouth every 4 (four) hours as needed. 15 tablet 0  . hydrOXYzine (ATARAX/VISTARIL) 25 MG tablet Take 1 tablet (25 mg total) by mouth every 8 (eight) hours as needed. 15 tablet 0  . sertraline (ZOLOFT) 50 MG tablet Take 1 tablet (50 mg total) by mouth at bedtime. 30 tablet 0  . traZODone (DESYREL) 100 MG tablet Take 1 tablet (100 mg total) by mouth at bedtime. 30 tablet 0   No current facility-administered medications for this visit.     OBJECTIVE: Vitals:   10/17/18 0921  BP: 104/73  Pulse: 88  Resp: 18  Temp: (!) 97.4 F (36.3 C)     Body mass index is 29.25 kg/m.    ECOG FS:1 - Symptomatic but completely ambulatory  General: Well-developed, well-nourished, no acute distress. Eyes: Pink conjunctiva, anicteric sclera. HEENT: Normocephalic, moist mucous membranes, clear oropharnyx. Lungs: Clear to auscultation bilaterally. Heart: Regular rate and rhythm. No  rubs, murmurs, or gallops. Abdomen: Soft, nontender, nondistended. No organomegaly noted, normoactive bowel sounds. Musculoskeletal: No edema, cyanosis, or clubbing. Neuro: Alert, answering all questions appropriately. Cranial nerves grossly intact. Skin: No rashes or petechiae noted. Psych: Normal affect. Lymphatics: No cervical, calvicular, axillary or inguinal LAD.   LAB RESULTS:  Lab  Results  Component Value Date   NA 138 10/14/2018   K 3.4 (L) 10/14/2018   CL 107 10/14/2018   CO2 25 10/14/2018   GLUCOSE 109 (H) 10/14/2018   BUN 10 10/14/2018   CREATININE 0.80 10/14/2018   CALCIUM 9.1 10/14/2018   PROT 6.8 10/14/2018   ALBUMIN 3.8 10/14/2018   AST 26 10/14/2018   ALT 15 10/14/2018   ALKPHOS 86 10/14/2018   BILITOT 0.9 10/14/2018   GFRNONAA >60 10/14/2018   GFRAA >60 10/14/2018    Lab Results  Component Value Date   WBC 8.7 10/14/2018   HGB 13.7 10/14/2018   HCT 41.5 10/14/2018   MCV 90.0 10/14/2018   PLT 284 10/14/2018     STUDIES: Dg Chest 2 View  Result Date: 10/14/2018 CLINICAL DATA:  Cough and shortness of breath for 2 weeks EXAM: CHEST - 2 VIEW COMPARISON:  04/25/2016 FINDINGS: Cardiac shadow is within normal limits. The lungs are well aerated bilaterally. Mild atelectatic changes are noted in the right lung base corresponding to the right middle lobe. No focal confluent infiltrate or sizable effusion is noted. No bony abnormality is seen. IMPRESSION: Mild right middle lobe atelectatic changes. Electronically Signed   By: Alcide Clever M.D.   On: 10/14/2018 16:42   Ct Angio Chest Pe W And/or Wo Contrast  Result Date: 10/14/2018 CLINICAL DATA:  Chest pain and shortness of breath EXAM: CT ANGIOGRAPHY CHEST WITH CONTRAST TECHNIQUE: Multidetector CT imaging of the chest was performed using the standard protocol during bolus administration of intravenous contrast. Multiplanar CT image reconstructions and MIPs were obtained to evaluate the vascular anatomy. CONTRAST:  75mL OMNIPAQUE IOHEXOL 350 MG/ML SOLN COMPARISON:  Chest radiograph October 14, 2018 FINDINGS: Cardiovascular: There is no demonstrable pulmonary embolus. There is no thoracic aortic aneurysm or dissection. Visualized great vessels appear unremarkable. No pericardial effusion or pericardial thickening evident. Mediastinum/Nodes: Thyroid appears unremarkable. There is no appreciable thoracic  adenopathy. No esophageal lesions are evident. Lungs/Pleura: There is a nodular appearing opacity which abuts and may arise from the left minor fissure in the superior lingula measuring 0.9 x 0.4 cm. This lesion is seen on axial slice 47 series 6, coronal slice 43 series 7, and sagittal slice 94 series 8. There is a nodular opacity in the medial aspect of the posterior segment of the left lower lobe measuring 1.8 x 1.5 x 1.4 cm. Lungs elsewhere clear. No pleural effusion or pleural thickening evident. Upper Abdomen: Visualized upper abdominal structures appear unremarkable. Musculoskeletal: No blastic or lytic bone lesions are evident. No chest wall lesions are appreciable. Review of the MIP images confirms the above findings. IMPRESSION: 1. No demonstrable pulmonary embolus. No thoracic aortic aneurysm or dissection. 2. Nodular lesions in the lingula and left lower lobe with the largest lesion in the posterior segment left lower lobe measuring 1.8 x 1.5 x 1.4 cm. Etiology for these nodular opacities is uncertain. Neoplastic etiology must be of concern. In this regard, it may be prudent to correlate with nuclear medicine PET study to further assess. 3.  No frank airspace consolidation evident. 4.  No appreciable thoracic adenopathy. Electronically Signed   By: Bretta Bang III M.D.  On: 10/14/2018 21:18    ASSESSMENT: Pulmonary nodules  PLAN:    1. Pulmonary nodules: CT scan results from October 14, 2018 reviewed independently and report as above with nodular lesions in the lingula and left lower lobe largest measuring approximately 1.8 cm.  Etiology is unclear, but likely not the cause of her symptoms.  We will get a PET scan to further evaluate and discuss patient at cancer conference next week.  If PET scan is positive, patient likely will require biopsy.  Return to clinic in approximately 1 week to discuss her imaging results and additional diagnostic planning. 2.  Chest pain/shortness of breath:  Unclear etiology.  Unlikely related to pulmonary nodules as above. 3.  Anxiety: Continue treatment per primary care.  I spent a total of 60 minutes face-to-face with the patient of which greater than 50% of the visit was spent in counseling and coordination of care as detailed above.   Patient expressed understanding and was in agreement with this plan. She also understands that She can call clinic at any time with any questions, concerns, or complaints.   Cancer Staging No matching staging information was found for the patient.  Jeralyn Ruthsimothy J Finnegan, MD   10/18/2018 8:17 AM

## 2018-10-19 DIAGNOSIS — R918 Other nonspecific abnormal finding of lung field: Secondary | ICD-10-CM | POA: Insufficient documentation

## 2018-10-19 NOTE — Progress Notes (Signed)
[facility]  Telephone:([phone] [phone]  ID: Alicia Whitney OB: 06-16-1986  MR#: [id number]  UYQ#:[id number]  Patient Care Team: Patient, No Pcp Per as PCP - General (General Practice) Glory Buff, RN as Registered Nurse  CHIEF COMPLAINT: Pulmonary nodules.  INTERVAL HISTORY: Patient returns to clinic today for discussion of her PET scan results and additional diagnostic planning.  She continues to complain of shortness of breath and chest pain which are chronic and unchanged.  She also continues to be highly anxious. She has no neurologic complaints.  She denies any recent fevers or illnesses.  She has a good appetite and denies weight loss.  She has no cough or hemoptysis.  She denies any nausea, vomiting, constipation, or diarrhea.  She has no urinary complaints.  Patient offers no further specific complaints today.  REVIEW OF SYSTEMS:   Review of Systems  Constitutional: Negative.  Negative for fever and malaise/fatigue.  Respiratory: Positive for shortness of breath. Negative for cough and hemoptysis.   Cardiovascular: Positive for chest pain. Negative for leg swelling.  Gastrointestinal: Negative.  Negative for abdominal pain, blood in stool and melena.  Genitourinary: Negative.  Negative for dysuria.  Musculoskeletal: Negative.  Negative for back pain.  Skin: Negative.  Negative for rash.  Neurological: Negative.  Negative for focal weakness, weakness and headaches.  Psychiatric/Behavioral: The patient is nervous/anxious.     As per HPI. Otherwise, a complete review of systems is negative.  PAST MEDICAL HISTORY: Past Medical History:  Diagnosis Date  . Anxiety   . Bipolar disorder (HCC)     PAST SURGICAL HISTORY: Past Surgical History:  Procedure Laterality Date  . CESAREAN SECTION    . DILATION AND CURETTAGE OF UTERUS    . TONSILLECTOMY    . TUBAL LIGATION      FAMILY HISTORY: History reviewed. No pertinent family  history.  ADVANCED DIRECTIVES (Y/N):  N  HEALTH MAINTENANCE: Social History   Tobacco Use  . Smoking status: Former Games developer  . Smokeless tobacco: Never Used  Substance Use Topics  . Alcohol use: Yes    Comment: 2x weekly, 1-2 drinks  . Drug use: Yes    Types: Oxycodone, Cocaine, Marijuana     Colonoscopy:  PAP:  Bone density:  Lipid panel:  Allergies  Allergen Reactions  . Onion   . Latex Rash    Current Outpatient Medications  Medication Sig Dispense Refill  . HYDROcodone-acetaminophen (NORCO/VICODIN) 5-325 MG tablet Take 1 tablet by mouth every 4 (four) hours as needed. 15 tablet 0  . hydrOXYzine (ATARAX/VISTARIL) 25 MG tablet Take 1 tablet (25 mg total) by mouth every 8 (eight) hours as needed. 15 tablet 0  . sertraline (ZOLOFT) 50 MG tablet Take 1 tablet (50 mg total) by mouth at bedtime. 30 tablet 0  . traZODone (DESYREL) 100 MG tablet Take 1 tablet (100 mg total) by mouth at bedtime. 30 tablet 0   No current facility-administered medications for this visit.     OBJECTIVE: Vitals:   10/23/18 1426  BP: 133/86  Pulse: 86  Resp: 18  Temp: (!) 97.5 F (36.4 C)  SpO2: 99%     Body mass index is 29.28 kg/m.    ECOG FS:1 - Symptomatic but completely ambulatory  General: Well-developed, well-nourished, no acute distress. Eyes: Pink conjunctiva, anicteric sclera. HEENT: Normocephalic, moist mucous membranes. Lungs: Clear to auscultation bilaterally. Heart: Regular rate and rhythm. No rubs, murmurs, or gallops. Abdomen: Soft, nontender, nondistended. No organomegaly noted, normoactive bowel sounds. Musculoskeletal:  No edema, cyanosis, or clubbing. Neuro: Alert, answering all questions appropriately. Cranial nerves grossly intact. Skin: No rashes or petechiae noted. Psych: Normal affect.  LAB RESULTS:  Lab Results  Component Value Date   NA 138 10/14/2018   K 3.4 (L) 10/14/2018   CL 107 10/14/2018   CO2 25 10/14/2018   GLUCOSE 109 (H) 10/14/2018   BUN 10  10/14/2018   CREATININE 0.80 10/14/2018   CALCIUM 9.1 10/14/2018   PROT 6.8 10/14/2018   ALBUMIN 3.8 10/14/2018   AST 26 10/14/2018   ALT 15 10/14/2018   ALKPHOS 86 10/14/2018   BILITOT 0.9 10/14/2018   GFRNONAA >60 10/14/2018   GFRAA >60 10/14/2018    Lab Results  Component Value Date   WBC 8.7 10/14/2018   HGB 13.7 10/14/2018   HCT 41.5 10/14/2018   MCV 90.0 10/14/2018   PLT 284 10/14/2018     STUDIES: Dg Chest 2 View  Result Date: 10/14/2018 CLINICAL DATA:  Cough and shortness of breath for 2 weeks EXAM: CHEST - 2 VIEW COMPARISON:  04/25/2016 FINDINGS: Cardiac shadow is within normal limits. The lungs are well aerated bilaterally. Mild atelectatic changes are noted in the right lung base corresponding to the right middle lobe. No focal confluent infiltrate or sizable effusion is noted. No bony abnormality is seen. IMPRESSION: Mild right middle lobe atelectatic changes. Electronically Signed   By: Alcide Clever M.D.   On: 10/14/2018 16:42   Ct Angio Chest Pe W And/or Wo Contrast  Result Date: 10/14/2018 CLINICAL DATA:  Chest pain and shortness of breath EXAM: CT ANGIOGRAPHY CHEST WITH CONTRAST TECHNIQUE: Multidetector CT imaging of the chest was performed using the standard protocol during bolus administration of intravenous contrast. Multiplanar CT image reconstructions and MIPs were obtained to evaluate the vascular anatomy. CONTRAST:  75mL OMNIPAQUE IOHEXOL 350 MG/ML SOLN COMPARISON:  Chest radiograph October 14, 2018 FINDINGS: Cardiovascular: There is no demonstrable pulmonary embolus. There is no thoracic aortic aneurysm or dissection. Visualized great vessels appear unremarkable. No pericardial effusion or pericardial thickening evident. Mediastinum/Nodes: Thyroid appears unremarkable. There is no appreciable thoracic adenopathy. No esophageal lesions are evident. Lungs/Pleura: There is a nodular appearing opacity which abuts and may arise from the left minor fissure in the  superior lingula measuring 0.9 x 0.4 cm. This lesion is seen on axial slice 47 series 6, coronal slice 43 series 7, and sagittal slice 94 series 8. There is a nodular opacity in the medial aspect of the posterior segment of the left lower lobe measuring 1.8 x 1.5 x 1.4 cm. Lungs elsewhere clear. No pleural effusion or pleural thickening evident. Upper Abdomen: Visualized upper abdominal structures appear unremarkable. Musculoskeletal: No blastic or lytic bone lesions are evident. No chest wall lesions are appreciable. Review of the MIP images confirms the above findings. IMPRESSION: 1. No demonstrable pulmonary embolus. No thoracic aortic aneurysm or dissection. 2. Nodular lesions in the lingula and left lower lobe with the largest lesion in the posterior segment left lower lobe measuring 1.8 x 1.5 x 1.4 cm. Etiology for these nodular opacities is uncertain. Neoplastic etiology must be of concern. In this regard, it may be prudent to correlate with nuclear medicine PET study to further assess. 3.  No frank airspace consolidation evident. 4.  No appreciable thoracic adenopathy. Electronically Signed   By: Bretta Bang III M.D.   On: 10/14/2018 21:18   Nm Pet Image Initial (pi) Skull Base To Thigh  Result Date: 10/23/2018 CLINICAL DATA:  Initial treatment strategy  for left pulmonary nodules. EXAM: NUCLEAR MEDICINE PET SKULL BASE TO THIGH TECHNIQUE: 9.5 mCi F-18 FDG was injected intravenously. Full-ring PET imaging was performed from the skull base to thigh after the radiotracer. CT data was obtained and used for attenuation correction and anatomic localization. Fasting blood glucose: One hundred nine mg/dl COMPARISON:  CT chest 29/56/213012/08/2018 FINDINGS: Mediastinal blood pool activity: SUV max 2.1 NECK: Symmetric glottic and tonsillar tissues, compatible with physiologic activity. Incidental CT findings: Chronic ethmoid, left sphenoid, and bilateral maxillary sinusitis. 5 mm in short axis left level V lymph node  is not pathologically enlarged by size criteria. CHEST: The pulmonary nodule at the left medial lung base measures 2.1 by 1.6 cm on image 116/3 and has a maximum SUV of 3.1. 1.0 by 0.4 cm lingular nodule, maximum SUV 1.1. No hypermetabolic adenopathy. Incidental CT findings: none ABDOMEN/PELVIS: The left ovary has a normal contour but along its lateral margin there is some focal accentuated metabolic activity with maximum SUV of 4.6. There is also a small right adnexal cyst along the posterior margin of the right ovary. Incidental CT findings: none SKELETON: No significant abnormal hypermetabolic activity in this region. Incidental CT findings: none IMPRESSION: 1. The 2.1 by 1.6 cm left lower lobe medial basilar nodule has a maximum SUV of 3.1. This does not have a classic appearance for rounded atelectasis and the SUV, although low-grade, is high enough that a low-grade malignancy is not readily excluded. Tissue diagnosis is recommended. 2. The 1.0 by 0.4 cm lingular nodule is not appreciably hypermetabolic but is below sensitive PET-CT size thresholds and merits surveillance. 3. Approximately 1 cm focus of accentuated activity along the lateral margin of the left ovary. Given the small size of the lesion, benign etiology such as a hypermetabolic corpus luteum cyst or small inflammatory lesion are favored, but pelvic sonography is recommended for further characterization. Electronically Signed   By: Gaylyn RongWalter  Liebkemann M.D.   On: 10/23/2018 14:37    ASSESSMENT: Pulmonary nodules  PLAN:    1. Pulmonary nodules: CT scan results from October 14, 2018 reviewed independently and reported as above with nodular lesions in the lingula and left lower lobe largest measuring approximately 1.8 cm.  The lingular lesion was noted on CT scan 2 years prior and has increased in size.  PET scan results from October 23, 2018 reviewed independently and reported as above with low hypermetabolic activity. Given the growth over 2  years and positive PET scan, this is concerning for low-grade malignancy and a referral was made to thoracic surgery for further evaluation.  Case was discussed at length at cancer conference.  Will get PFTs in the next 1 to 2 weeks.  Return to clinic after her evaluation by surgery for further evaluation.   2.  Chest pain/shortness of breath: Unclear etiology.  Unlikely related to pulmonary nodules as above. 3.  Anxiety: Chronic.  Continue treatment per primary care.  I spent a total of 30 minutes face-to-face with the patient of which greater than 50% of the visit was spent in counseling and coordination of care as detailed above.   Patient expressed understanding and was in agreement with this plan. She also understands that She can call clinic at any time with any questions, concerns, or complaints.   Cancer Staging No matching staging information was found for the patient.  Jeralyn Ruthsimothy J Finnegan, MD   10/25/2018 8:34 AM

## 2018-10-23 ENCOUNTER — Inpatient Hospital Stay (HOSPITAL_BASED_OUTPATIENT_CLINIC_OR_DEPARTMENT_OTHER): Payer: Self-pay | Admitting: Oncology

## 2018-10-23 ENCOUNTER — Encounter: Payer: Self-pay | Admitting: *Deleted

## 2018-10-23 ENCOUNTER — Ambulatory Visit
Admission: RE | Admit: 2018-10-23 | Discharge: 2018-10-23 | Disposition: A | Discharge status: Home / Self Care | Payer: Self-pay | Source: Ambulatory Visit | Attending: Oncology | Admitting: Oncology

## 2018-10-23 ENCOUNTER — Other Ambulatory Visit: Payer: Self-pay

## 2018-10-23 ENCOUNTER — Encounter: Payer: Self-pay | Admitting: Oncology

## 2018-10-23 DIAGNOSIS — R911 Solitary pulmonary nodule: Secondary | ICD-10-CM

## 2018-10-23 DIAGNOSIS — Z79899 Other long term (current) drug therapy: Secondary | ICD-10-CM

## 2018-10-23 DIAGNOSIS — R918 Other nonspecific abnormal finding of lung field: Secondary | ICD-10-CM | POA: Insufficient documentation

## 2018-10-23 DIAGNOSIS — N838 Other noninflammatory disorders of ovary, fallopian tube and broad ligament: Secondary | ICD-10-CM | POA: Insufficient documentation

## 2018-10-23 DIAGNOSIS — Z87891 Personal history of nicotine dependence: Secondary | ICD-10-CM

## 2018-10-23 DIAGNOSIS — R079 Chest pain, unspecified: Secondary | ICD-10-CM

## 2018-10-23 DIAGNOSIS — R0602 Shortness of breath: Secondary | ICD-10-CM

## 2018-10-23 DIAGNOSIS — F419 Anxiety disorder, unspecified: Secondary | ICD-10-CM

## 2018-10-23 LAB — GLUCOSE, CAPILLARY: Glucose-Capillary: 109 mg/dL — ABNORMAL HIGH (ref 70–99)

## 2018-10-23 MED ORDER — FLUDEOXYGLUCOSE F - 18 (FDG) INJECTION
9.4000 | Freq: Once | INTRAVENOUS | Status: AC | PRN
  Administered 2018-10-23: 9.45 via INTRAVENOUS

## 2018-10-23 NOTE — Progress Notes (Signed)
Patient here today for follow up regarding pulmonary nodule, PET scan results.

## 2018-10-23 NOTE — Progress Notes (Signed)
Tumor Board Documentation  Alicia MuleRebecca Whitney was presented by Dr Orlie DakinFinnegan at our Tumor Board on 10/23/2018, which included representatives from medical oncology, radiation oncology, navigation, internal medicine, pathology, radiology, surgical, genetics, nutrition, research, pulmonology.  Alicia Whitney currently presents as a new patient, for discussion, for MDC, for new tumor(s) with history of the following treatments: active survellience.  Additionally, we reviewed previous medical and familial history, history of present illness, and recent lab results along with all available histopathologic and imaging studies. The tumor board considered available treatment options and made the following recommendations: Biopsy, Surgery Refer to Dr Thelma Bargeaks for Wedge resection vs CT iopsy  The following procedures/referrals were also placed: No orders of the defined types were placed in this encounter.   Clinical Trial Status: not discussed   Staging used:    National site-specific guidelines   were discussed with respect to the case.  Tumor board is a meeting of clinicians from various specialty areas who evaluate and discuss patients for whom a multidisciplinary approach is being considered. Final determinations in the plan of care are those of the provider(s). The responsibility for follow up of recommendations given during tumor board is that of the provider.   Today's extended care, comprehensive team conference, Alicia Whitney was not present for the discussion and was not examined.   Multidisciplinary Tumor Board is a multidisciplinary case peer review process.  Decisions discussed in the Multidisciplinary Tumor Board reflect the opinions of the specialists present at the conference without having examined the patient.  Ultimately, treatment and diagnostic decisions rest with the primary provider(s) and the patient.

## 2018-10-23 NOTE — Progress Notes (Signed)
  Oncology Nurse Navigator Documentation  Navigator Location: [facility]Med [facility]c (10/23/18 1300) Referral date to RadOnc/MedOnc: 10/16/18 (10/23/18 1300) )Navigator Encounter Type: Follow-up Appt (10/23/18 1300)   Abnormal Finding Date: 10/14/18 (10/23/18 1300)                 Patient Visit Type: MedOnc (10/23/18 1300) Treatment Phase: Abnormal Scans (10/23/18 1300) Barriers/Navigation Needs: Coordination of Care (10/23/18 1300)   Interventions: Coordination of Care (10/23/18 1300)   Coordination of Care: Appts (10/23/18 1300)        Acuity: Level 2 (10/23/18 1300)   Acuity Level 2: Initial guidance, education and coordination as needed;Educational needs;Assistance expediting appointments (10/23/18 1300)    assisted patient with coordinating appointments for PFT's and referral to see Dr. Thelma Bargeaks for possible resection. Pt will be notified with appts at follow up appt with Dr. Orlie DakinFinnegan today. Will give contact info if patient has any further questions or needs. Nothing further needed at this time. Time Spent with Patient: 30 (10/23/18 1300)

## 2018-10-27 ENCOUNTER — Ambulatory Visit: Payer: Self-pay | Admitting: Cardiothoracic Surgery

## 2018-10-31 ENCOUNTER — Ambulatory Visit (INDEPENDENT_AMBULATORY_CARE_PROVIDER_SITE_OTHER): Payer: Self-pay | Admitting: Cardiothoracic Surgery

## 2018-10-31 ENCOUNTER — Encounter: Payer: Self-pay | Admitting: Cardiothoracic Surgery

## 2018-10-31 ENCOUNTER — Other Ambulatory Visit: Payer: Self-pay

## 2018-10-31 VITALS — BP 131/83 | HR 90 | Temp 97.2°F | Resp 16 | Ht 66.0 in | Wt 178.0 lb

## 2018-10-31 DIAGNOSIS — R918 Other nonspecific abnormal finding of lung field: Secondary | ICD-10-CM

## 2018-10-31 NOTE — Progress Notes (Signed)
Patient ID: Alicia MuleRebecca Whitney, female   DOB: 04/21/1986, 32 y.o.   MRN: [id number]  Chief Complaint  Patient presents with  . New Patient (Initial Visit)    Lung nodule    Referred By Dr. Gerarda Fractionimothy Finnegan Reason for Referral left upper and left lower lobe mass  HPI Location, Quality, Duration, Severity, Timing, Context, Modifying Factors, Associated Signs and Symptoms.  Alicia Whitney is a 32 y.o. female.  About 1 month ago she began experiencing substernal chest pain associated with shortness of breath.  She presented to the emergency department where a CT scan of the chest was performed.  This revealed 2 lesions in the left lung the dominant lesion was in the left lower lobe but a second nodule was identified in the lingula.  The lesion in the left lower lobe measured approximately 2 cm in size.  She had a prior CT scan performed 2 years ago of the abdomen which revealed a small area measuring about 1 cm and this area was not present for years ago in the left lower lobe.  Therefore the left lower lobe nodule has increased over the last 4 years.  The lingular nodule however was not previously imaged.  Therefore it is unclear what this may be.  She was referred to Dr. Gerarda Fractionimothy Finnegan who performed a PET scan.  The PET scan did not reveal any obvious uptake outside of the left lower lobe nodule which had a minimal SUV uptake of 3.1.  The patient does tell me that she continues to have pain substernal Alicia Whitney located.  It is constant and is present throughout the day.  There is nothing that alleviates or makes this worse.  She does state that she gets somewhat short of breath at night whenever she is lying down.  She has had a 50 pound weight gain over the last several months.  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  She has had headaches off and on for the last 3 months which have been new for her.  She denied any fevers or chills.  She denied any cough.  She is a  smoker in the past but has not smoked recently.  She does not vapor smoke marijuana.  She currently is employed in retail.  She is married.  She has 3 children live with her ex-husband.   Past Medical History:  Diagnosis Date  . Anxiety   . Bipolar disorder (HCC)   . Depression     Past Surgical History:  Procedure Laterality Date  . CESAREAN SECTION    . DILATION AND CURETTAGE OF UTERUS    . TONSILLECTOMY    . TUBAL LIGATION      Family History  Problem Relation Age of Onset  . Cancer Maternal Aunt     Social History Social History   Tobacco Use  . Smoking status: Former Smoker    Last attempt to quit: 2012    Years since quitting: 7.9  . Smokeless tobacco: Never Used  Substance Use Topics  . Alcohol use: Yes    Comment: 2x weekly, 1-2 drinks  . Drug use: Not Currently    Allergies  Allergen Reactions  . Onion   . Latex Rash    Current Outpatient Medications  Medication Sig Dispense Refill  . hydrOXYzine (ATARAX/VISTARIL) 25 MG tablet Take 1 tablet (25 mg total) by mouth every 8 (eight) hours as needed. (Patient not taking: Reported on 10/31/2018) 15 tablet 0  .  sertraline (ZOLOFT) 50 MG tablet Take 1 tablet (50 mg total) by mouth at bedtime. (Patient not taking: Reported on 10/31/2018) 30 tablet 0  . traZODone (DESYREL) 100 MG tablet Take 1 tablet (100 mg total) by mouth at bedtime. (Patient not taking: Reported on 10/31/2018) 30 tablet 0   No current facility-administered medications for this visit.       Review of Systems A complete review of systems was asked and was negative except for the following positive findings weight gain, night sweats, fatigue, chest pain and shortness of breath  Blood pressure 131/83, pulse 90, temperature (!) 97.2 F (36.2 C), temperature source Tympanic, resp. rate 16, height 5\' 6"  (1.676 m), weight 178 lb (80.7 kg), SpO2 96 %.  Physical Exam CONSTITUTIONAL:  Pleasant, well-developed, well-nourished, and in no acute  distress. EYES: Pupils equal and reactive to light, Sclera non-icteric EARS, NOSE, MOUTH AND THROAT:  The oropharynx was clear.  Dentition is absent.  Oral mucosa pink and moist. LYMPH NODES:  Lymph nodes in the neck and axillae were normal RESPIRATORY:  Lungs were clear.  Normal respiratory effort without pathologic use of accessory muscles of respiration CARDIOVASCULAR: Heart was regular without murmurs.  There were no carotid bruits. GI: The abdomen was soft, nontender, and nondistended. There were no palpable masses. There was no hepatosplenomegaly. There were normal bowel sounds in all quadrants. GU:  Rectal deferred.   MUSCULOSKELETAL:  Normal muscle strength and tone.  No clubbing or cyanosis.   SKIN:  There were no pathologic skin lesions.  There were no nodules on palpation. NEUROLOGIC:  Sensation is normal.  Cranial nerves are grossly intact. PSYCH:  Oriented to person, place and time.  Mood and affect are normal.  Data Reviewed CT scans  I have personally reviewed the patient's imaging, laboratory findings and medical records.    Assessment    Left upper and left lower lobe nodules.  I had a long discussion with her regarding the possibilities.  The lingular lesion is unknown if this is new or old but the left lower lobe nodule certainly has increased in size over the last several years.  I reviewed with her the options including percutaneous biopsy or surgery.  Given her young age I encouraged her to consider surgical intervention.    Plan    We will obtain a MRI of her brain.  We will also get a complete set of pulmonary function studies and I will see her back in 2 weeks.  I reviewed with her and her husband today the indications and risks of left thoracotomy with wedge resection of both upper and lower lobe nodules with additional surgical resection based upon those findings.  She understands and would like Alicia Whitney to proceed.      Alicia Marinimothy Kashena Novitski, MD 10/31/2018, 9:08 AM

## 2018-10-31 NOTE — Patient Instructions (Addendum)
Please keep your appointment to have your pulmonary functions testing on 11/13/2018 at 1:30.   We will schedule the  MRI Brain.   We will see you back in the office to discuss the above results with you.

## 2018-11-07 ENCOUNTER — Ambulatory Visit (HOSPITAL_COMMUNITY)
Admission: RE | Admit: 2018-11-07 | Discharge: 2018-11-07 | Disposition: A | Discharge status: Home / Self Care | Payer: Self-pay | Source: Ambulatory Visit | Attending: Cardiothoracic Surgery | Admitting: Cardiothoracic Surgery

## 2018-11-07 DIAGNOSIS — R918 Other nonspecific abnormal finding of lung field: Secondary | ICD-10-CM | POA: Insufficient documentation

## 2018-11-07 MED ORDER — GADOBUTROL 1 MMOL/ML IV SOLN
10.0000 mL | Freq: Once | INTRAVENOUS | Status: AC | PRN
  Administered 2018-11-07: 8 mL via INTRAVENOUS

## 2018-11-10 ENCOUNTER — Other Ambulatory Visit: Payer: Self-pay | Admitting: Nurse Practitioner

## 2018-11-10 ENCOUNTER — Ambulatory Visit
Admission: RE | Admit: 2018-11-10 | Discharge: 2018-11-10 | Disposition: A | Discharge status: Home / Self Care | Payer: Self-pay | Source: Ambulatory Visit | Attending: Nurse Practitioner | Admitting: Nurse Practitioner

## 2018-11-10 DIAGNOSIS — R918 Other nonspecific abnormal finding of lung field: Secondary | ICD-10-CM

## 2018-11-11 ENCOUNTER — Telehealth: Payer: Self-pay | Admitting: *Deleted

## 2018-11-11 ENCOUNTER — Inpatient Hospital Stay (HOSPITAL_BASED_OUTPATIENT_CLINIC_OR_DEPARTMENT_OTHER): Payer: Self-pay | Admitting: Oncology

## 2018-11-11 ENCOUNTER — Other Ambulatory Visit: Payer: Self-pay

## 2018-11-11 ENCOUNTER — Encounter: Payer: Self-pay | Admitting: *Deleted

## 2018-11-11 ENCOUNTER — Other Ambulatory Visit: Payer: Self-pay | Admitting: *Deleted

## 2018-11-11 ENCOUNTER — Inpatient Hospital Stay: Payer: Self-pay | Attending: Oncology

## 2018-11-11 ENCOUNTER — Encounter: Payer: Self-pay | Admitting: Oncology

## 2018-11-11 VITALS — BP 115/80 | HR 81 | Temp 97.8°F | Resp 18 | Wt 177.0 lb

## 2018-11-11 DIAGNOSIS — R918 Other nonspecific abnormal finding of lung field: Secondary | ICD-10-CM | POA: Insufficient documentation

## 2018-11-11 DIAGNOSIS — R059 Cough, unspecified: Secondary | ICD-10-CM

## 2018-11-11 DIAGNOSIS — R05 Cough: Secondary | ICD-10-CM

## 2018-11-11 LAB — COMPREHENSIVE METABOLIC PANEL
ALT: 13 U/L (ref 0–44)
AST: 17 U/L (ref 15–41)
Albumin: 4.2 g/dL (ref 3.5–5.0)
Alkaline Phosphatase: 92 U/L (ref 38–126)
Anion gap: 7 (ref 5–15)
BUN: 13 mg/dL (ref 6–20)
CHLORIDE: 108 mmol/L (ref 98–111)
CO2: 24 mmol/L (ref 22–32)
Calcium: 9.2 mg/dL (ref 8.9–10.3)
Creatinine, Ser: 0.73 mg/dL (ref 0.44–1.00)
GFR calc Af Amer: >60 mL/min (ref >60)
GFR calc non Af Amer: >60 mL/min (ref >60)
Glucose, Bld: 105 mg/dL — ABNORMAL HIGH (ref 70–99)
Potassium: 3.6 mmol/L (ref 3.5–5.1)
Sodium: 139 mmol/L (ref 135–145)
Total Bilirubin: 0.9 mg/dL (ref 0.3–1.2)
Total Protein: 7.4 g/dL (ref 6.5–8.1)

## 2018-11-11 LAB — CBC WITH DIFFERENTIAL/PLATELET
Abs Immature Granulocytes: 0.01 10*3/uL (ref 0.00–0.07)
BASOS ABS: 0 10*3/uL (ref 0.0–0.1)
Basophils Relative: 1 %
Eosinophils Absolute: 0.1 10*3/uL (ref 0.0–0.5)
Eosinophils Relative: 2 %
HCT: 39 % (ref 36.0–46.0)
HEMOGLOBIN: 13.1 g/dL (ref 12.0–15.0)
Immature Granulocytes: 0 %
LYMPHS PCT: 32 %
Lymphs Abs: 2.3 10*3/uL (ref 0.7–4.0)
MCH: 29.4 pg (ref 26.0–34.0)
MCHC: 33.6 g/dL (ref 30.0–36.0)
MCV: 87.6 fL (ref 80.0–100.0)
Monocytes Absolute: 0.6 10*3/uL (ref 0.1–1.0)
Monocytes Relative: 9 %
NEUTROS ABS: 4.2 10*3/uL (ref 1.7–7.7)
Neutrophils Relative %: 56 %
Platelets: 206 10*3/uL (ref 150–400)
RBC: 4.45 MIL/uL (ref 3.87–5.11)
RDW: 12.1 % (ref 11.5–15.5)
WBC: 7.2 10*3/uL (ref 4.0–10.5)
nRBC: 0 % (ref 0.0–0.2)

## 2018-11-11 MED ORDER — PREDNISONE 10 MG (21) PO TBPK: ORAL_TABLET | ORAL | 0 refills | Status: DC

## 2018-11-11 MED ORDER — HYDROCOD POLST-CPM POLST ER 10-8 MG/5ML PO SUER: 5.0000 mL | Freq: Two times a day (BID) | ORAL | 0 refills | Status: DC

## 2018-11-11 NOTE — Progress Notes (Signed)
Patient here today to be evaluated in the [facility] for persistent, dry cough which is worse at night, specifically when she is lying down. She reports pain associated with the cough. OTC cough medication doesn't seem to help.

## 2018-11-11 NOTE — Patient Instructions (Signed)
Brompheniramine; Dextromethorphan; Pseudoephedrine oral solution What is this medicine? BROMPHENIRAMINE; DEXTROMETHORPHAN; PSEUDOEPHEDRINE (brome fen IR a meen; dex troe meth OR fan; soo doe e FED rin) is a histamine blocker, cough suppressant, and a decongestant. It can help relieve cough, runny nose, stuffy nose, sneezing, and itchy or watery eyes. This medicine is used to treat allergy and cold symptoms. This medicine will not treat an infection. This medicine may be used for other purposes; ask your health care provider or pharmacist if you have questions. COMMON BRAND NAME(S): Anaplex, Andehist DM, Andehist DM NR, Brom/PSE/DM Cough, Bromaline DM, Bromatane DX, Bromaxefed DM RF, Bromdex D, Brometane DX, Bromfed-DM, Bromhist DM, Bromhist PDX, Bromophed DX, Bromphenex DM, Bromplex DM, Brotapp-DM, BroveX PSB DM, Carbofed DM, Cardec DM, Dallergy DM, Decon DM, Dimetane DX, Dimetapp Children's DM Cold and Cough, Dynatuss, EndaCof-DM, LoHist PSB DM, Myphetane DX, Neo DM, PBM Allergy, PediaHist DM, Q-Tapp DM, Robitussin Cough and Allergy, Rondamine DM, Rondec DM, Sildec DM, Tuss Mine DM What should I tell my health care provider before I take this medicine? They need to know if you have any of these conditions: -asthma -blood vessel disease -diabetes -difficulty passing urine -glaucoma -high blood pressure -other chronic disease -stomach ulcer -taken an MAOI like Carbex, Eldepryl, Marplan, Nardil, or Parnate in last 14 days -thyroid disease -an unusual or allergic reaction to brompheniramine, dextromethorphan, pseudoephedrine, other medicines, foods, dyes, or preservatives -pregnant or trying to get pregnant -breast-feeding How should I use this medicine? Take this medicine by mouth with a full glass of water. Follow the directions on the prescription label. Use a specially marked spoon or container to measure your medicine. Household spoons are not accurate. Take this medicine with food or milk if  it upsets your stomach. Take your doses at regular times. Do not take more medicine than directed. Talk to your pediatrician regarding the use of this medicine in children. While this drug may be prescribed for children as young as 33 years old for selected conditions, precautions do apply. Patients over 33 years old may have a stronger reaction to this medicine and need smaller doses. Overdosage: If you think you have taken too much of this medicine contact a poison control center or emergency room at once. NOTE: This medicine is only for you. Do not share this medicine with others. What if I miss a dose? If you miss a dose, take it as soon as you can. If it is almost time for your next dose, take only that dose. Do not take double or extra doses. What may interact with this medicine? Do not take this medicine with any of the following medications: -MAOIs like Carbex, Eldepryl, Marplan, Nardil, and Parnate This medicine may also interact with the following medications: -any product that contains alcohol -any stimulant drug -barbiturates -mecamylamine -medicines for anxiety or sleep -medicines for chest pain, heart disease, blood pressure or heart rhythm problems -medicines for colds or allergies -medicines for depression, anxiety, or psychotic disturbances -reserpine -some herbal or nutritional supplements -some medicines for pain -some medicines for Parkinson's disease This list may not describe all possible interactions. Give your health care provider a list of all the medicines, herbs, non-prescription drugs, or dietary supplements you use. Also tell them if you smoke, drink alcohol, or use illegal drugs. Some items may interact with your medicine. What should I watch for while using this medicine? Tell your doctor or health care professional if your symptoms do not improve or if they get worse. If you have  trouble falling asleep at night, take the last dose of the day at least a few  hours before bedtime. You may get drowsy or dizzy. Do not drive, use machinery, or do anything that needs mental alertness until you know how this medicine affects you. To reduce the risk of dizzy or fainting spells, do not stand or sit up quickly, especially if you are an older patient. Alcohol may increase dizziness and drowsiness. Avoid alcoholic drinks. Your mouth may get dry. Chewing sugarless gum or sucking hard candy, and drinking plenty of water may help. Contact your doctor if the problem does not go away or is severe. This medicine may cause dry eyes and blurred vision. If you wear contact lenses you may feel some discomfort. Lubricating drops may help. See your eye doctor if the problem does not go away or is severe. What side effects may I notice from receiving this medicine? Side effects that you should report to your doctor or health care professional as soon as possible: -allergic reactions like skin rash, itching or hives, swelling of the face, lips, or tongue -breathing problems -changes in vision -fast or irregular heartbeat -feeling faint or lightheaded, falls -hallucinations -high blood pressure -seizure -trouble passing urine or change in the amount of urine -vomiting Side effects that usually do not require medical attention (report to your doctor or health care professional if they continue or are bothersome): -anxiety -diarrhea -headache -loss of appetite -nausea This list may not describe all possible side effects. Call your doctor for medical advice about side effects. You may report side effects to FDA at 1-800-FDA-1088. Where should I keep my medicine? Keep out of the reach of children. Store between 8 and 30 degrees C (46 and 86 degrees F). Protect from heat and light. Throw away any unused medicine after the expiration date. NOTE: This sheet is a summary. It may not cover all possible information. If you have questions about this medicine, talk to your doctor,  pharmacist, or health care provider.  2019 Elsevier/Gold Standard (2008-01-22 13:58:07) Cough, Adult  A cough helps to clear your throat and lungs. A cough may last only 2-3 weeks (acute), or it may last longer than 8 weeks (chronic). Many different things can cause a cough. A cough may be a sign of an illness or another medical condition. Follow these instructions at home:  Pay attention to any changes in your cough.  Take medicines only as told by your doctor. ? If you were prescribed an antibiotic medicine, take it as told by your doctor. Do not stop taking it even if you start to feel better. ? Talk with your doctor before you try using a cough medicine.  Drink enough fluid to keep your pee (urine) clear or pale yellow.  If the air is dry, use a cold steam vaporizer or humidifier in your home.  Stay away from things that make you cough at work or at home.  If your cough is worse at night, try using extra pillows to raise your head up higher while you sleep.  Do not smoke, and try not to be around smoke. If you need help quitting, ask your doctor.  Do not have caffeine.  Do not drink alcohol.  Rest as needed. Contact a doctor if:  You have new problems (symptoms).  You cough up yellow fluid (pus).  Your cough does not get better after 2-3 weeks, or your cough gets worse.  Medicine does not help your cough and you  are not sleeping well.  You have pain that gets worse or pain that is not helped with medicine.  You have a fever.  You are losing weight and you do not know why.  You have night sweats. Get help right away if:  You cough up blood.  You have trouble breathing.  Your heartbeat is very fast. This information is not intended to replace advice given to you by your health care provider. Make sure you discuss any questions you have with your health care provider. Document Released: 07/05/2011 Document Revised: 03/29/2016 Document Reviewed:  12/29/2014 Elsevier Interactive Patient Education  2019 [employer].

## 2018-11-11 NOTE — Telephone Encounter (Signed)
Pt given information to contact [facility] to establish PCP. Pt verbalized understanding.

## 2018-11-11 NOTE — Progress Notes (Signed)
Symptom Management Consult note [facility]  Telephone:([phone] [phone]  Patient Care Team: Patient, No Pcp Per as PCP - General ([place]) Telford Nab, RN as Registered Nurse   Name of the patient: Alicia Whitney  [id number]  [birth year]   Date of visit: 11/11/2018  Diagnosis: 1. Pulmonary nodules - CBC with Differential; Future - Comprehensive metabolic panel; Future   Chief Complaint: Cough/wheezing  Current Treatment: Incidental lung nodules found.  Currently being worked up by Dr. Grayland Ormond and Dr. Genevive Bi.  Scheduled to have a second consultation with Dr. Genevive Bi for possible wedge resection. Appt scheduled for 11/14/18.   Oncology History: Patient with initial presentation to the emergency room on 10/14/2018 for abdominal pain, chest pain and shortness of breath.  Work-up included a CT scan revealing 2 lesions in the left lung with a dominant lesion to left lower lobe.  Previously had CT scan approximately 2 years ago which revealed a similar lesion measuring about 1 cm representing an increase over the few years.  Referred to Dr. Grayland Ormond on 10/17/2018 where a PET scan was ordered.  Scan on 10/23/2018 revealed a mild SUV of 3.1 of left lower lobe; although low-grade metabolic activity could not exclude malignancy.  Small lingular nodule not appreciably hypermetabolic.   Referred to Dr. Faith Rogue on 10/31/2018 where they further discussed findings of PET scan.  He recommended surgical intervention versus percutaneous biopsy given her age.   Further work-up included an MRI of her brain which was negative for malignancy.  Pulmonary function testing is scheduled for 11/13/2018.   Follow-up with Dr. Genevive Bi is scheduled for 11/14/2018. Surgery in the next few weeks.  Subjective Data:  ECOG: 1 - Symptomatic but completely ambulatory  Subjective:     Alicia Whitney is a 33 y.o. female here for evaluation of a cough. Onset of symptoms was 1 month  ago. Symptoms have been rapidly worsening since that time. The cough is dry, nocturnal, nonproductive and painful and is aggravated by reclining position. Associated symptoms include: chest pain, night sweats, shortness of breath and wheezing. Patient does not have a history of asthma. Patient does not have a history of environmental allergens. Patient has not traveled recently. Patient does have a history of smoking. Patient has had a previous chest x-ray. Patient has had a PPD done.  Recent diagnosis of  pulmonary nodules worrisome for malignancy.  Had chest x-ray completed yesterday.   The following portions of the patient's history were reviewed and updated as appropriate: allergies, current medications, past family history, past medical history, past social history, past surgical history and problem list.  Review of Systems A comprehensive review of systems was negative except for: Constitutional: positive for chills, fatigue and night sweats Respiratory: positive for cough, dyspnea on exertion, pleurisy/chest pain and wheezing Cardiovascular: positive for chest pain, dyspnea and fatigue Gastrointestinal: positive for constipation    Objective:    Oxygen saturation 99% on room air BP 115/80 (BP Location: Left Arm, Patient Position: Sitting)   Pulse 81   Temp 97.8 F (36.6 C) (Tympanic)   Resp 18   Wt 177 lb (80.3 kg)   SpO2 98%   BMI 28.57 kg/m  General appearance: alert, fatigued and no distress Back: symmetric, no curvature. ROM normal. No CVA tenderness. Lungs: wheezes LLL Chest wall: no tenderness Heart: regular rate and rhythm, S1, S2 normal, no murmur, click, rub or gallop Abdomen: soft, non-tender; bowel sounds normal; no masses,  no organomegaly    Assessment:  Unclear etiology.  Possible diagnoses include URI, bronchitis and/or postnasal drip. Unlikely related to pulmonary nodules.    Plan:   Increasing pulmonary nodules worrisome for malignancy: Recently completed  work-up including CT scan (completed in the emergency room) and PET scan revealing mild hypermetabolic leisons in left lung.  Met with Dr. Genevive Bi who recommends surgical intervention versus biopsy given her young age.  She is scheduled to have pulmonary function tests on 11/13/2018 and to see Dr. Genevive Bi on 11/14/2018.  Thought to possibly schedule surgery the following week with wedge resection.   Cough: Had chest x-ray completed yesterday revealing no acute process.  No need for antibiotics. Likely viral given blood counts are stable, afebrile and clean chest x-ray.  Unlikely related to pulmonary nodules given the lesions are small.  Cough appears to be disrupting her sleep.  Has tried multiple OTC cough medicines without relief.  Will try Tussionex at bedtime. Possible trial of steroids after touching base with Dr. Genevive Bi.   Plan: Stat chest x-ray.  Results: No acute process. Stat labs.  Normal. Assessment.  Afebrile.  Mild wheezing heard in left lower lobe. Rx Tussionex. Rx prednisone taper.  Patient is uninsured.  We will Reach Out to [facility] to see if medication can be covered by [employer].  Approved by Elease Etienne.  [facility].   Greater than 50% was spent in counseling and coordination of care with this patient including but not limited to discussion of the relevant topics above (See A&P) including, but not limited to diagnosis and management of acute and chronic medical conditions.   Faythe Casa, NP 11/11/2018 11:06 AM

## 2018-11-13 ENCOUNTER — Ambulatory Visit: Payer: Self-pay | Attending: Oncology

## 2018-11-13 DIAGNOSIS — R911 Solitary pulmonary nodule: Secondary | ICD-10-CM

## 2018-11-13 MED ORDER — ALBUTEROL SULFATE (2.5 MG/3ML) 0.083% IN NEBU
2.5000 mg | INHALATION_SOLUTION | Freq: Once | RESPIRATORY_TRACT | Status: AC
  Administered 2018-11-13: 2.5 mg via RESPIRATORY_TRACT
  Filled 2018-11-13: qty 3

## 2018-11-14 ENCOUNTER — Other Ambulatory Visit: Payer: Self-pay | Admitting: Cardiothoracic Surgery

## 2018-11-14 ENCOUNTER — Encounter: Payer: Self-pay | Admitting: Cardiothoracic Surgery

## 2018-11-14 ENCOUNTER — Telehealth: Payer: Self-pay

## 2018-11-14 ENCOUNTER — Ambulatory Visit (INDEPENDENT_AMBULATORY_CARE_PROVIDER_SITE_OTHER): Payer: Self-pay | Admitting: Cardiothoracic Surgery

## 2018-11-14 ENCOUNTER — Other Ambulatory Visit: Payer: Self-pay

## 2018-11-14 VITALS — BP 117/82 | HR 82 | Temp 97.7°F | Ht 66.0 in | Wt 175.6 lb

## 2018-11-14 DIAGNOSIS — R918 Other nonspecific abnormal finding of lung field: Secondary | ICD-10-CM

## 2018-11-14 NOTE — Telephone Encounter (Signed)
Spoke with patient about scheduling her surgery. The patient is scheduled for surgery at [facility] with Dr Thelma Barge on 11/20/18. She will pre admit by phone. The patient is aware of date and instructions. She is aware to call the day before for arrival time. Dr Henrene Dodge will be assisting with this case.

## 2018-11-14 NOTE — H&P (View-Only) (Signed)
  Patient ID: Alicia Whitney, female   DOB: 09/30/1986, 33 y.o.   MRN: [id number]  HISTORY: She returns today in follow-up.  She has had a slight cold and had a chest x-ray made last week which did not show any evidence of pneumonia.  She was given Tussionex and states that she is improved with that.  She states that her cough is improved.  She has no fevers or chills.   Vitals:   11/14/18 0828  BP: 117/82  Pulse: 82  Temp: 97.7 F (36.5 C)  SpO2: 98%     EXAM:    Resp: Lungs are clear bilaterally.  No respiratory distress, normal effort. Heart:  Regular without murmurs Abd:  Abdomen is soft, non distended and non tender. No masses are palpable.  There is no rebound and no guarding.  Neurological: Alert and oriented to person, place, and time. Coordination normal.  Skin: Skin is warm and dry. No rash noted. No diaphoretic. No erythema. No pallor.  Psychiatric: Normal mood and affect. Normal behavior. Judgment and thought content normal.    ASSESSMENT: I have independently reviewed the MRI of the brain.  That did not reveal any evidence of intracranial metastases.  Her pulmonary function studies reveal an FEV1 and a DLCO of approximately 80%.   PLAN:   I had a long discussion with her today regarding the indications and risks of surgical resection.  We also discussed the role that percutaneous biopsy may play.  She does not wish to pursue any biopsies.  I reviewed with her the indications and risks of a left thoracotomy with wedge resection lobectomy or even pneumonectomy.  I explained to her that the gold standard of care was lobectomy for early stages of lung cancer.  She understands that the lesion in the left upper lobe and the left lower lobe are possible malignancies.  We reviewed the options.  We discussed the role of frozen section.  After extensive discussion she would like us to proceed with surgery.    Merilyn Pagan, MD 

## 2018-11-14 NOTE — Patient Instructions (Addendum)
Patient needs to be scheduled for surgery with Dr.Oaks.   Call the office with any questions or concerns.

## 2018-11-14 NOTE — Progress Notes (Signed)
  Patient ID: Alicia Whitney, female   DOB: [birth year], 33 y.o.   MRN: [id number]  HISTORY: She returns today in follow-up.  She has had a slight cold and had a chest x-ray made last week which did not show any evidence of pneumonia.  She was given Tussionex and states that she is improved with that.  She states that her cough is improved.  She has no fevers or chills.   Vitals:   11/14/18 0828  BP: 117/82  Pulse: 82  Temp: 97.7 F (36.5 C)  SpO2: 98%     EXAM:    Resp: Lungs are clear bilaterally.  No respiratory distress, normal effort. Heart:  Regular without murmurs Abd:  Abdomen is soft, non distended and non tender. No masses are palpable.  There is no rebound and no guarding.  Neurological: Alert and oriented to person, place, and time. Coordination normal.  Skin: Skin is warm and dry. No rash noted. No diaphoretic. No erythema. No pallor.  Psychiatric: Normal mood and affect. Normal behavior. Judgment and thought content normal.    ASSESSMENT: I have independently reviewed the MRI of the brain.  That did not reveal any evidence of intracranial metastases.  Her pulmonary function studies reveal an FEV1 and a DLCO of approximately 80%.   PLAN:   I had a long discussion with her today regarding the indications and risks of surgical resection.  We also discussed the role that percutaneous biopsy may play.  She does not wish to pursue any biopsies.  I reviewed with her the indications and risks of a left thoracotomy with wedge resection lobectomy or even pneumonectomy.  I explained to her that the gold standard of care was lobectomy for early stages of lung cancer.  She understands that the lesion in the left upper lobe and the left lower lobe are possible malignancies.  We reviewed the options.  We discussed the role of frozen section.  After extensive discussion she would like [place] to proceed with surgery.    Hulda Marin, MD

## 2018-11-19 ENCOUNTER — Encounter
Admission: RE | Admit: 2018-11-19 | Discharge: 2018-11-19 | Disposition: A | Discharge status: Home / Self Care | Payer: Self-pay | Source: Ambulatory Visit | Attending: Cardiothoracic Surgery | Admitting: Cardiothoracic Surgery

## 2018-11-19 ENCOUNTER — Other Ambulatory Visit: Payer: Self-pay

## 2018-11-19 HISTORY — DX: Unspecified convulsions: R56.9

## 2018-11-19 MED ORDER — CEFAZOLIN SODIUM-DEXTROSE 2-4 GM/100ML-% IV SOLN
2.0000 g | INTRAVENOUS | Status: AC
  Administered 2018-11-20 (×2): 1 g via INTRAVENOUS

## 2018-11-19 NOTE — Patient Instructions (Signed)
Your procedure is scheduled on: 11-20-18 Report to Same Day Surgery 2nd floor medical mall [facility] Entrance-take elevator on left to 2nd floor.  Check in with surgery information desk.) To find out your arrival time please call [phone] between 1PM - 3PM on 11-19-18  Remember: Instructions that are not followed completely may result in serious medical risk, up to and including death, or upon the discretion of your surgeon and anesthesiologist your surgery may need to be rescheduled.    _x___ 1. Do not eat food after midnight the night before your procedure. You may drink clear liquids up to 2 hours before you are scheduled to arrive at the hospital for your procedure.  Do not drink clear liquids within 2 hours of your scheduled arrival to the hospital.  Clear liquids include  --Water or Apple juice without pulp  --Clear carbohydrate beverage such as ClearFast or Gatorade  --Black Coffee or Clear Tea (No milk, no creamers, do not add anything to the coffee or Tea   ____Ensure clear carbohydrate drink on the way to the hospital for bariatric patients  ____Ensure clear carbohydrate drink 3 hours before surgery for Dr Rutherford Nail patients if physician instructed.   No gum chewing or hard candies.     __x__ 2. No Alcohol for 24 hours before or after surgery.   __x__3. No Smoking or e-cigarettes for 24 prior to surgery.  Do not use any chewable tobacco products for at least 6 hour prior to surgery   ____  4. Bring all medications with you on the day of surgery if instructed.    __x__ 5. Notify your doctor if there is any change in your medical condition     (cold, fever, infections).    x___6. On the morning of surgery brush your teeth with toothpaste and water.  You may rinse your mouth with mouth wash if you wish.  Do not swallow any toothpaste or mouthwash.   Do not wear jewelry, make-up, hairpins, clips or nail polish.  Do not wear lotions, powders, or perfumes. You may wear  deodorant.  Do not shave 48 hours prior to surgery. Men may shave face and neck.  Do not bring valuables to the hospital.    [facility] is not responsible for any belongings or valuables.               Contacts, dentures or bridgework may not be worn into surgery.  Leave your suitcase in the car. After surgery it may be brought to your room.  For patients admitted to the hospital, discharge time is determined by your  treatment team.  _  Patients discharged the day of surgery will not be allowed to drive home.  You will need someone to drive you home and stay with you the night of your procedure.    Please read over the following fact sheets that you were given:   [facility] Preparing for Surgery   _x___ Take anti-hypertensive listed below, cardiac, seizure, asthma,     anti-reflux and psychiatric medicines. These include:  1. YOU MAY TAKE YOUR HYDROXYZINE DAY OF SURGERY IF NEEDED WITH A SMALL SIP OF WATER  2.  3.  4.  5.  6.  ____Fleets enema or Magnesium Citrate as directed.   ____ Use CHG Soap or sage wipes as directed on instruction sheet   ____ Use inhalers on the day of surgery and bring to hospital day of surgery  ____ Stop Metformin and Janumet 2 days  prior to surgery.    ____ Take 1/2 of usual insulin dose the night before surgery and none on the morning surgery.   ____ Follow recommendations from Cardiologist, Pulmonologist or PCP regarding stopping Aspirin, Coumadin, Plavix ,Eliquis, Effient, or Pradaxa, and Pletal.  X____Stop Anti-inflammatories such as Advil, Aleve, Ibuprofen, Motrin, Naproxen, Naprosyn, Goodies powders or aspirin products NOW-OK to take Tylenol    ____ Stop supplements until after surgery.    ____ Bring C-Pap to the hospital.

## 2018-11-20 ENCOUNTER — Inpatient Hospital Stay: Payer: Self-pay

## 2018-11-20 ENCOUNTER — Inpatient Hospital Stay: Payer: Self-pay | Admitting: Anesthesiology

## 2018-11-20 ENCOUNTER — Inpatient Hospital Stay
Admission: RE | Admit: 2018-11-20 | Discharge: 2018-11-25 | DRG: 164 | Disposition: A | Discharge status: Home / Self Care | Payer: Self-pay | Attending: Cardiothoracic Surgery | Admitting: Cardiothoracic Surgery

## 2018-11-20 ENCOUNTER — Encounter: Payer: Self-pay | Admitting: *Deleted

## 2018-11-20 ENCOUNTER — Encounter
Admission: RE | Disposition: A | Discharge status: Home / Self Care | Payer: Self-pay | Source: Home / Self Care | Attending: Cardiothoracic Surgery

## 2018-11-20 DIAGNOSIS — E876 Hypokalemia: Secondary | ICD-10-CM | POA: Diagnosis present

## 2018-11-20 DIAGNOSIS — R569 Unspecified convulsions: Secondary | ICD-10-CM | POA: Diagnosis present

## 2018-11-20 DIAGNOSIS — Z9689 Presence of other specified functional implants: Secondary | ICD-10-CM

## 2018-11-20 DIAGNOSIS — R918 Other nonspecific abnormal finding of lung field: Secondary | ICD-10-CM

## 2018-11-20 DIAGNOSIS — R000 Tachycardia, unspecified: Secondary | ICD-10-CM | POA: Diagnosis not present

## 2018-11-20 DIAGNOSIS — Z87891 Personal history of nicotine dependence: Secondary | ICD-10-CM

## 2018-11-20 DIAGNOSIS — F419 Anxiety disorder, unspecified: Secondary | ICD-10-CM | POA: Diagnosis present

## 2018-11-20 DIAGNOSIS — Z9851 Tubal ligation status: Secondary | ICD-10-CM

## 2018-11-20 DIAGNOSIS — F319 Bipolar disorder, unspecified: Secondary | ICD-10-CM | POA: Diagnosis present

## 2018-11-20 DIAGNOSIS — E669 Obesity, unspecified: Secondary | ICD-10-CM | POA: Diagnosis present

## 2018-11-20 DIAGNOSIS — Z01810 Encounter for preprocedural cardiovascular examination: Secondary | ICD-10-CM

## 2018-11-20 DIAGNOSIS — N390 Urinary tract infection, site not specified: Secondary | ICD-10-CM | POA: Diagnosis present

## 2018-11-20 DIAGNOSIS — Z6829 Body mass index (BMI) 29.0-29.9, adult: Secondary | ICD-10-CM

## 2018-11-20 DIAGNOSIS — Z09 Encounter for follow-up examination after completed treatment for conditions other than malignant neoplasm: Secondary | ICD-10-CM

## 2018-11-20 HISTORY — PX: THORACOTOMY: SHX5074

## 2018-11-20 LAB — CBC WITH DIFFERENTIAL/PLATELET
Abs Immature Granulocytes: 0.02 10*3/uL (ref 0.00–0.07)
Basophils Absolute: 0.1 10*3/uL (ref 0.0–0.1)
Basophils Relative: 1 %
Eosinophils Absolute: 0.2 10*3/uL (ref 0.0–0.5)
Eosinophils Relative: 2 %
HEMATOCRIT: 37.4 % (ref 36.0–46.0)
Hemoglobin: 12.5 g/dL (ref 12.0–15.0)
Immature Granulocytes: 0 %
LYMPHS ABS: 3.3 10*3/uL (ref 0.7–4.0)
Lymphocytes Relative: 45 %
MCH: 29.6 pg (ref 26.0–34.0)
MCHC: 33.4 g/dL (ref 30.0–36.0)
MCV: 88.6 fL (ref 80.0–100.0)
MONO ABS: 0.6 10*3/uL (ref 0.1–1.0)
Monocytes Relative: 8 %
Neutro Abs: 3.1 10*3/uL (ref 1.7–7.7)
Neutrophils Relative %: 44 %
Platelets: 198 10*3/uL (ref 150–400)
RBC: 4.22 MIL/uL (ref 3.87–5.11)
RDW: 12.2 % (ref 11.5–15.5)
WBC: 7.2 10*3/uL (ref 4.0–10.5)
nRBC: 0 % (ref 0.0–0.2)

## 2018-11-20 LAB — URINE DRUG SCREEN, QUALITATIVE (ARMC ONLY)
Amphetamines, Ur Screen: NOT DETECTED
Barbiturates, Ur Screen: NOT DETECTED
Benzodiazepine, Ur Scrn: NOT DETECTED
Cannabinoid 50 Ng, Ur ~~LOC~~: NOT DETECTED
Cocaine Metabolite,Ur ~~LOC~~: NOT DETECTED
MDMA (Ecstasy)Ur Screen: NOT DETECTED
Methadone Scn, Ur: NOT DETECTED
Opiate, Ur Screen: POSITIVE — AB
Phencyclidine (PCP) Ur S: NOT DETECTED
Tricyclic, Ur Screen: POSITIVE — AB

## 2018-11-20 LAB — APTT: aPTT: 29 seconds (ref 24–36)

## 2018-11-20 LAB — COMPREHENSIVE METABOLIC PANEL
ALT: 15 U/L (ref 0–44)
AST: 18 U/L (ref 15–41)
Albumin: 3.7 g/dL (ref 3.5–5.0)
Alkaline Phosphatase: 88 U/L (ref 38–126)
Anion gap: 6 (ref 5–15)
BUN: 10 mg/dL (ref 6–20)
CO2: 24 mmol/L (ref 22–32)
Calcium: 8.6 mg/dL — ABNORMAL LOW (ref 8.9–10.3)
Chloride: 107 mmol/L (ref 98–111)
Creatinine, Ser: 0.92 mg/dL (ref 0.44–1.00)
GFR calc non Af Amer: >60 mL/min (ref >60)
Glucose, Bld: 109 mg/dL — ABNORMAL HIGH (ref 70–99)
Potassium: 3.2 mmol/L — ABNORMAL LOW (ref 3.5–5.1)
Sodium: 137 mmol/L (ref 135–145)
Total Bilirubin: 0.4 mg/dL (ref 0.3–1.2)
Total Protein: 6.6 g/dL (ref 6.5–8.1)

## 2018-11-20 LAB — ABO/RH: ABO/RH(D): AB POS

## 2018-11-20 LAB — PROTIME-INR
INR: 0.95
Prothrombin Time: 12.6 seconds (ref 11.4–15.2)

## 2018-11-20 LAB — MRSA PCR SCREENING: MRSA by PCR: NEGATIVE

## 2018-11-20 LAB — POCT URINE PREGNANCY: Preg Test, Ur: NEGATIVE

## 2018-11-20 LAB — GLUCOSE, CAPILLARY: Glucose-Capillary: 141 mg/dL — ABNORMAL HIGH (ref 70–99)

## 2018-11-20 SURGERY — THORACOTOMY, MAJOR
Anesthesia: General | Laterality: Left

## 2018-11-20 MED ORDER — MORPHINE SULFATE (PF) 2 MG/ML IV SOLN
1.0000 mg | INTRAVENOUS | Status: DC | PRN
  Administered 2018-11-20 (×5): 2 mg via INTRAVENOUS
  Administered 2018-11-20: 1 mg via INTRAVENOUS
  Administered 2018-11-21 (×8): 2 mg via INTRAVENOUS
  Filled 2018-11-20 (×14): qty 1

## 2018-11-20 MED ORDER — TRAZODONE HCL 50 MG PO TABS
75.0000 mg | ORAL_TABLET | Freq: Every day | ORAL | Status: DC
  Administered 2018-11-20 – 2018-11-24 (×5): 75 mg via ORAL
  Filled 2018-11-20 (×5): qty 2

## 2018-11-20 MED ORDER — CEFAZOLIN SODIUM-DEXTROSE 2-4 GM/100ML-% IV SOLN
2.0000 g | Freq: Three times a day (TID) | INTRAVENOUS | Status: AC
  Administered 2018-11-20 – 2018-11-21 (×2): 2 g via INTRAVENOUS
  Filled 2018-11-20 (×2): qty 100

## 2018-11-20 MED ORDER — HYDROXYZINE HCL 25 MG PO TABS
25.0000 mg | ORAL_TABLET | Freq: Three times a day (TID) | ORAL | Status: DC | PRN
  Administered 2018-11-24: 25 mg via ORAL
  Filled 2018-11-20: qty 1

## 2018-11-20 MED ORDER — ROCURONIUM BROMIDE 50 MG/5ML IV SOLN
INTRAVENOUS | Status: AC
  Filled 2018-11-20: qty 1

## 2018-11-20 MED ORDER — BISACODYL 5 MG PO TBEC
10.0000 mg | DELAYED_RELEASE_TABLET | Freq: Every day | ORAL | Status: DC
  Administered 2018-11-20 – 2018-11-25 (×6): 10 mg via ORAL
  Filled 2018-11-20 (×6): qty 2

## 2018-11-20 MED ORDER — EPHEDRINE SULFATE 50 MG/ML IJ SOLN
INTRAMUSCULAR | Status: DC | PRN
  Administered 2018-11-20: 5 mg via INTRAVENOUS

## 2018-11-20 MED ORDER — FAMOTIDINE 20 MG PO TABS
20.0000 mg | ORAL_TABLET | Freq: Once | ORAL | Status: AC
  Administered 2018-11-20: 20 mg via ORAL

## 2018-11-20 MED ORDER — FENTANYL CITRATE (PF) 100 MCG/2ML IJ SOLN
INTRAMUSCULAR | Status: AC
  Administered 2018-11-20: 50 ug via INTRAVENOUS
  Filled 2018-11-20: qty 2

## 2018-11-20 MED ORDER — LAMOTRIGINE 100 MG PO TABS
50.0000 mg | ORAL_TABLET | Freq: Every day | ORAL | Status: DC
  Administered 2018-11-20 – 2018-11-24 (×5): 50 mg via ORAL
  Filled 2018-11-20 (×3): qty 1
  Filled 2018-11-20: qty 2
  Filled 2018-11-20: qty 1

## 2018-11-20 MED ORDER — BUPIVACAINE HCL (PF) 0.25 % IJ SOLN
INTRAMUSCULAR | Status: AC
  Filled 2018-11-20: qty 30

## 2018-11-20 MED ORDER — PROPOFOL 500 MG/50ML IV EMUL
INTRAVENOUS | Status: DC | PRN
  Administered 2018-11-20: 20 ug/kg/min via INTRAVENOUS

## 2018-11-20 MED ORDER — ACETAMINOPHEN 10 MG/ML IV SOLN
INTRAVENOUS | Status: DC | PRN
  Administered 2018-11-20: 1000 mg via INTRAVENOUS

## 2018-11-20 MED ORDER — CEFAZOLIN SODIUM 1 G IJ SOLR
INTRAMUSCULAR | Status: AC
  Filled 2018-11-20: qty 10

## 2018-11-20 MED ORDER — ACETAMINOPHEN 10 MG/ML IV SOLN
INTRAVENOUS | Status: AC
  Filled 2018-11-20: qty 100

## 2018-11-20 MED ORDER — KETOROLAC TROMETHAMINE 30 MG/ML IJ SOLN
30.0000 mg | Freq: Three times a day (TID) | INTRAMUSCULAR | Status: DC | PRN
  Administered 2018-11-20 – 2018-11-21 (×2): 30 mg via INTRAVENOUS
  Filled 2018-11-20 (×2): qty 1

## 2018-11-20 MED ORDER — PROPOFOL 10 MG/ML IV BOLUS
INTRAVENOUS | Status: AC
  Filled 2018-11-20: qty 40

## 2018-11-20 MED ORDER — MIDAZOLAM HCL 2 MG/2ML IJ SOLN
INTRAMUSCULAR | Status: DC | PRN
  Administered 2018-11-20: 2 mg via INTRAVENOUS

## 2018-11-20 MED ORDER — FENTANYL CITRATE (PF) 100 MCG/2ML IJ SOLN
INTRAMUSCULAR | Status: DC | PRN
  Administered 2018-11-20: 100 ug via INTRAVENOUS
  Administered 2018-11-20 (×2): 50 ug via INTRAVENOUS

## 2018-11-20 MED ORDER — BUPIVACAINE LIPOSOME 1.3 % IJ SUSP
INTRAMUSCULAR | Status: AC
  Filled 2018-11-20: qty 20

## 2018-11-20 MED ORDER — HYDROMORPHONE HCL 1 MG/ML IJ SOLN
INTRAMUSCULAR | Status: AC
  Filled 2018-11-20: qty 1

## 2018-11-20 MED ORDER — KETAMINE HCL 50 MG/ML IJ SOLN
INTRAMUSCULAR | Status: AC
  Filled 2018-11-20: qty 10

## 2018-11-20 MED ORDER — PHENYLEPHRINE HCL 10 MG/ML IJ SOLN
INTRAMUSCULAR | Status: DC | PRN
  Administered 2018-11-20: 50 ug via INTRAVENOUS

## 2018-11-20 MED ORDER — CHLORHEXIDINE GLUCONATE CLOTH 2 % EX PADS
6.0000 | MEDICATED_PAD | Freq: Once | CUTANEOUS | Status: AC
  Administered 2018-11-20: 6 via TOPICAL

## 2018-11-20 MED ORDER — CHLORHEXIDINE GLUCONATE CLOTH 2 % EX PADS: 6.0000 | MEDICATED_PAD | Freq: Once | CUTANEOUS | Status: DC

## 2018-11-20 MED ORDER — MEPERIDINE HCL 50 MG/ML IJ SOLN: 6.2500 mg | INTRAMUSCULAR | Status: DC | PRN

## 2018-11-20 MED ORDER — BUPIVACAINE HCL 0.25 % IJ SOLN
INTRAMUSCULAR | Status: DC | PRN
  Administered 2018-11-20: 30 mL

## 2018-11-20 MED ORDER — SEVOFLURANE IN SOLN
RESPIRATORY_TRACT | Status: AC
  Filled 2018-11-20: qty 250

## 2018-11-20 MED ORDER — PROPOFOL 10 MG/ML IV BOLUS
INTRAVENOUS | Status: AC
  Filled 2018-11-20: qty 20

## 2018-11-20 MED ORDER — ROCURONIUM BROMIDE 100 MG/10ML IV SOLN
INTRAVENOUS | Status: DC | PRN
  Administered 2018-11-20: 20 mg via INTRAVENOUS
  Administered 2018-11-20: 10 mg via INTRAVENOUS
  Administered 2018-11-20: 50 mg via INTRAVENOUS
  Administered 2018-11-20: 10 mg via INTRAVENOUS

## 2018-11-20 MED ORDER — HYDROMORPHONE HCL 1 MG/ML IJ SOLN
INTRAMUSCULAR | Status: DC | PRN
  Administered 2018-11-20: 0.5 mg via INTRAVENOUS

## 2018-11-20 MED ORDER — CEFAZOLIN SODIUM-DEXTROSE 2-4 GM/100ML-% IV SOLN
INTRAVENOUS | Status: AC
  Filled 2018-11-20: qty 100

## 2018-11-20 MED ORDER — OXYCODONE HCL 5 MG PO TABS
5.0000 mg | ORAL_TABLET | ORAL | Status: DC | PRN
  Administered 2018-11-20 (×2): 5 mg via ORAL
  Filled 2018-11-20 (×2): qty 1

## 2018-11-20 MED ORDER — OXYCODONE HCL 5 MG PO TABS: 5.0000 mg | ORAL_TABLET | Freq: Once | ORAL | Status: DC | PRN

## 2018-11-20 MED ORDER — ONDANSETRON HCL 4 MG/2ML IJ SOLN
INTRAMUSCULAR | Status: DC | PRN
  Administered 2018-11-20: 4 mg via INTRAVENOUS

## 2018-11-20 MED ORDER — DEXAMETHASONE SODIUM PHOSPHATE 10 MG/ML IJ SOLN
INTRAMUSCULAR | Status: DC | PRN
  Administered 2018-11-20: 10 mg via INTRAVENOUS

## 2018-11-20 MED ORDER — OXYCODONE-ACETAMINOPHEN 7.5-325 MG PO TABS
1.0000 | ORAL_TABLET | ORAL | Status: DC | PRN
  Administered 2018-11-20 – 2018-11-25 (×17): 2 via ORAL
  Filled 2018-11-20 (×17): qty 2

## 2018-11-20 MED ORDER — SERTRALINE HCL 50 MG PO TABS
50.0000 mg | ORAL_TABLET | Freq: Every day | ORAL | Status: DC
  Administered 2018-11-20 – 2018-11-24 (×5): 50 mg via ORAL
  Filled 2018-11-20 (×5): qty 1

## 2018-11-20 MED ORDER — PHENYLEPHRINE HCL 10 MG/ML IJ SOLN
INTRAMUSCULAR | Status: AC
  Filled 2018-11-20: qty 1

## 2018-11-20 MED ORDER — LIDOCAINE HCL (CARDIAC) PF 100 MG/5ML IV SOSY
PREFILLED_SYRINGE | INTRAVENOUS | Status: DC | PRN
  Administered 2018-11-20: 100 mg via INTRAVENOUS

## 2018-11-20 MED ORDER — DEXTROSE-NACL 5-0.45 % IV SOLN
INTRAVENOUS | Status: DC
  Administered 2018-11-20 – 2018-11-22 (×5): via INTRAVENOUS

## 2018-11-20 MED ORDER — SUGAMMADEX SODIUM 200 MG/2ML IV SOLN
INTRAVENOUS | Status: DC | PRN
  Administered 2018-11-20: 159.2 mg via INTRAVENOUS

## 2018-11-20 MED ORDER — SODIUM CHLORIDE (PF) 0.9 % IJ SOLN
INTRAMUSCULAR | Status: AC
  Filled 2018-11-20: qty 50

## 2018-11-20 MED ORDER — MIDAZOLAM HCL 2 MG/2ML IJ SOLN
INTRAMUSCULAR | Status: AC
  Filled 2018-11-20: qty 2

## 2018-11-20 MED ORDER — LACTATED RINGERS IV SOLN
INTRAVENOUS | Status: DC
  Administered 2018-11-20: 07:00:00 via INTRAVENOUS

## 2018-11-20 MED ORDER — PROPOFOL 10 MG/ML IV BOLUS
INTRAVENOUS | Status: DC | PRN
  Administered 2018-11-20: 200 mg via INTRAVENOUS

## 2018-11-20 MED ORDER — ALBUTEROL SULFATE (2.5 MG/3ML) 0.083% IN NEBU
2.5000 mg | INHALATION_SOLUTION | RESPIRATORY_TRACT | Status: DC
  Administered 2018-11-20 – 2018-11-21 (×5): 2.5 mg via RESPIRATORY_TRACT
  Filled 2018-11-20 (×6): qty 3

## 2018-11-20 MED ORDER — ONDANSETRON HCL 4 MG/2ML IJ SOLN
4.0000 mg | Freq: Four times a day (QID) | INTRAMUSCULAR | Status: DC | PRN
  Administered 2018-11-20 – 2018-11-23 (×3): 4 mg via INTRAVENOUS
  Filled 2018-11-20 (×3): qty 2

## 2018-11-20 MED ORDER — OXYCODONE HCL 5 MG/5ML PO SOLN: 5.0000 mg | Freq: Once | ORAL | Status: DC | PRN

## 2018-11-20 MED ORDER — LIDOCAINE HCL 4 % MT SOLN
OROMUCOSAL | Status: DC | PRN
  Administered 2018-11-20: 3 mL via TOPICAL

## 2018-11-20 MED ORDER — PROMETHAZINE HCL 25 MG/ML IJ SOLN: 6.2500 mg | INTRAMUSCULAR | Status: DC | PRN

## 2018-11-20 MED ORDER — SODIUM CHLORIDE 0.9 % IV SOLN
INTRAVENOUS | Status: DC | PRN
  Administered 2018-11-20: 30 ug/min via INTRAVENOUS

## 2018-11-20 MED ORDER — TRAMADOL HCL 50 MG PO TABS
50.0000 mg | ORAL_TABLET | Freq: Four times a day (QID) | ORAL | Status: DC
  Administered 2018-11-20: 100 mg via ORAL
  Administered 2018-11-21 (×2): 50 mg via ORAL
  Filled 2018-11-20: qty 1
  Filled 2018-11-20: qty 2
  Filled 2018-11-20: qty 1

## 2018-11-20 MED ORDER — FAMOTIDINE 20 MG PO TABS
ORAL_TABLET | ORAL | Status: AC
  Administered 2018-11-20: 20 mg via ORAL
  Filled 2018-11-20: qty 1

## 2018-11-20 MED ORDER — FENTANYL CITRATE (PF) 100 MCG/2ML IJ SOLN
25.0000 ug | INTRAMUSCULAR | Status: DC | PRN
  Administered 2018-11-20 (×2): 50 ug via INTRAVENOUS

## 2018-11-20 MED ORDER — KETAMINE HCL 10 MG/ML IJ SOLN
INTRAMUSCULAR | Status: DC | PRN
  Administered 2018-11-20: 10 mg via INTRAVENOUS
  Administered 2018-11-20: 30 mg via INTRAVENOUS
  Administered 2018-11-20: 10 mg via INTRAVENOUS

## 2018-11-20 MED ORDER — FENTANYL CITRATE (PF) 250 MCG/5ML IJ SOLN
INTRAMUSCULAR | Status: AC
  Filled 2018-11-20: qty 5

## 2018-11-20 MED ORDER — SUGAMMADEX SODIUM 200 MG/2ML IV SOLN
INTRAVENOUS | Status: AC
  Filled 2018-11-20: qty 2

## 2018-11-20 MED ORDER — SODIUM CHLORIDE 0.9 % IV SOLN
INTRAVENOUS | Status: DC | PRN
  Administered 2018-11-20: 60 mL

## 2018-11-20 MED ORDER — EPHEDRINE SULFATE 50 MG/ML IJ SOLN
INTRAMUSCULAR | Status: AC
  Filled 2018-11-20: qty 1

## 2018-11-20 SURGICAL SUPPLY — 73 items
BENZOIN TINCTURE PRP APPL 2/3 (GAUZE/BANDAGES/DRESSINGS) ×2 IMPLANT
BNDG COHESIVE 4X5 TAN STRL (GAUZE/BANDAGES/DRESSINGS) IMPLANT
BRONCHOSCOPE PED SLIM DISP (MISCELLANEOUS) ×2 IMPLANT
CANISTER SUCT 1200ML W/VALVE (MISCELLANEOUS) ×2 IMPLANT
CATH  RT ANGL  28F  SOF (CATHETERS) ×1
CATH RT ANGL 28F SOF (CATHETERS) IMPLANT
CATH THOR STR 28F  SOFT WA (CATHETERS) ×1
CATH THOR STR 28F SOFT WA (CATHETERS) IMPLANT
CATH URET ROBINSON 16FR STRL (CATHETERS) ×2 IMPLANT
CHLORAPREP W/TINT 26ML (MISCELLANEOUS) ×4 IMPLANT
CNTNR SPEC 2.5X3XGRAD LEK (MISCELLANEOUS) ×4
CONN REDUCER 3/8X3/8X3/8Y (CONNECTOR) ×2
CONNECTOR REDUCER 3/8X3/8 (MISCELLANEOUS) ×2 IMPLANT
CONNECTOR REDUCER 3/8X3/8X3/8Y (CONNECTOR) IMPLANT
CONT SPEC 4OZ STER OR WHT (MISCELLANEOUS) ×4
CONTAINER SPEC 2.5X3XGRAD LEK (MISCELLANEOUS) ×4 IMPLANT
CUTTER ECHEON FLEX ENDO 45 340 (ENDOMECHANICALS) IMPLANT
DRAIN CHEST DRY SUCT SGL (MISCELLANEOUS) ×2 IMPLANT
DRAPE C-SECTION (MISCELLANEOUS) ×2 IMPLANT
DRAPE MAG INST 16X20 L/F (DRAPES) ×2 IMPLANT
DRSG OPSITE POSTOP 4X6 (GAUZE/BANDAGES/DRESSINGS) ×2 IMPLANT
DRSG OPSITE POSTOP 4X8 (GAUZE/BANDAGES/DRESSINGS) ×2 IMPLANT
DRSG TELFA 3X8 NADH (GAUZE/BANDAGES/DRESSINGS) ×2 IMPLANT
ELECT BLADE 6.5 EXT (BLADE) ×2 IMPLANT
ELECT CAUTERY BLADE TIP 2.5 (TIP) ×2
ELECT REM PT RETURN 9FT ADLT (ELECTROSURGICAL) ×2
ELECTRODE CAUTERY BLDE TIP 2.5 (TIP) ×1 IMPLANT
ELECTRODE REM PT RTRN 9FT ADLT (ELECTROSURGICAL) ×1 IMPLANT
GAUZE SPONGE 4X4 12PLY STRL (GAUZE/BANDAGES/DRESSINGS) ×2 IMPLANT
GLOVE SURG SYN 7.5  E (GLOVE) ×2
GLOVE SURG SYN 7.5 E (GLOVE) ×2 IMPLANT
GLOVE SURG SYN 7.5 PF PI (GLOVE) ×2 IMPLANT
GOWN STRL REUS W/ TWL LRG LVL3 (GOWN DISPOSABLE) ×3 IMPLANT
GOWN STRL REUS W/TWL LRG LVL3 (GOWN DISPOSABLE) ×3
KIT TURNOVER KIT A (KITS) ×2 IMPLANT
LABEL OR SOLS (LABEL) ×2 IMPLANT
LOOP RED MAXI  1X406MM (MISCELLANEOUS) ×1
LOOP VESSEL MAXI 1X406 RED (MISCELLANEOUS) ×1 IMPLANT
MARKER SKIN DUAL TIP RULER LAB (MISCELLANEOUS) ×2 IMPLANT
NDL SPNL 22GX3.5 QUINCKE BK (NEEDLE) ×1 IMPLANT
NEEDLE SPNL 22GX3.5 QUINCKE BK (NEEDLE) ×2 IMPLANT
PACK BASIN MAJOR ARMC (MISCELLANEOUS) ×2 IMPLANT
PAD DRESSING TELFA 3X8 NADH (GAUZE/BANDAGES/DRESSINGS) ×1 IMPLANT
RELOAD PROXIMATE TA60MM GREEN (ENDOMECHANICALS) IMPLANT
RELOAD STAPLE 35X2.5 WHT THIN (STAPLE) ×4 IMPLANT
RELOAD STAPLE 60 4.7 GRN THCK (ENDOMECHANICALS) IMPLANT
RELOAD STAPLER LINE PROX 30 GR (STAPLE) ×1 IMPLANT
SPONGE KITTNER 5P (MISCELLANEOUS) ×2 IMPLANT
STAPLE RELOAD 2.5MM WHITE (STAPLE) ×8 IMPLANT
STAPLER RELOAD LINE PROX 30 GR (STAPLE) ×2
STAPLER RELOADABLE 30 GRN THCK (STAPLE) ×1 IMPLANT
STAPLER SKIN PROX 35W (STAPLE) ×2 IMPLANT
STAPLER VASCULAR ECHELON 35 (CUTTER) IMPLANT
STRIP CLOSURE SKIN 1/2X4 (GAUZE/BANDAGES/DRESSINGS) ×2 IMPLANT
SUT MNCRL AB 3-0 PS2 27 (SUTURE) IMPLANT
SUT PROLENE 5 0 RB 1 DA (SUTURE) IMPLANT
SUT SILK 0 (SUTURE) ×1
SUT SILK 0 30XBRD TIE 6 (SUTURE) ×1 IMPLANT
SUT SILK 1 SH (SUTURE) ×14 IMPLANT
SUT VIC AB 0 CT1 36 (SUTURE) ×4 IMPLANT
SUT VIC AB 2-0 CT1 27 (SUTURE) ×2
SUT VIC AB 2-0 CT1 TAPERPNT 27 (SUTURE) ×2 IMPLANT
SUT VICRYL 2 TP 1 (SUTURE) ×6 IMPLANT
SYR 10ML SLIP (SYRINGE) ×2 IMPLANT
SYR BULB IRRIG 60ML STRL (SYRINGE) ×2 IMPLANT
TAPE CLOTH 3X10 WHT NS LF (GAUZE/BANDAGES/DRESSINGS) ×2 IMPLANT
TAPE TRANSPORE STRL 2 31045 (GAUZE/BANDAGES/DRESSINGS) IMPLANT
TRAY FOLEY MTR SLVR 16FR STAT (SET/KITS/TRAYS/PACK) ×2 IMPLANT
TROCAR FLEXIPATH 20X80 (ENDOMECHANICALS) IMPLANT
TROCAR FLEXIPATH THORACIC 15MM (ENDOMECHANICALS) IMPLANT
TUBING CONNECTING 10 (TUBING) ×2 IMPLANT
WATER STERILE IRR 1000ML POUR (IV SOLUTION) ×2 IMPLANT
YANKAUER SUCT BULB TIP FLEX NO (MISCELLANEOUS) ×2 IMPLANT

## 2018-11-20 NOTE — Anesthesia Procedure Notes (Addendum)
Procedure Name: Intubation Date/Time: 11/20/2018 7:48 AM Performed by: Denyse Dago, RN Pre-anesthesia Checklist: Patient identified, Emergency Drugs available, Suction available, Patient being monitored and Timeout performed Patient Re-evaluated:Patient Re-evaluated prior to induction Oxygen Delivery Method: Circle system utilized Preoxygenation: Pre-oxygenation with 100% oxygen Induction Type: IV induction Ventilation: Mask ventilation without difficulty Laryngoscope Size: Miller and 3 Grade View: Grade I Endobronchial tube: Double lumen EBT, EBT position confirmed by auscultation and EBT position confirmed by fiberoptic bronchoscope and 37 Fr Number of attempts: 1 Airway Equipment and Method: Stylet and LTA kit utilized Placement Confirmation: ETT inserted through vocal cords under direct vision,  positive ETCO2,  CO2 detector and breath sounds checked- equal and bilateral Secured at: 25 cm Tube secured with: Tape Dental Injury: Teeth and Oropharynx as per pre-operative assessment

## 2018-11-20 NOTE — Transfer of Care (Signed)
Immediate Anesthesia Transfer of Care Note  Patient: Alicia Whitney  Procedure(s) Performed: PREOP BRONCHOSCOPY, THORACOTOMY AND LUNG RESECTION (Left )  Patient Location: PACU  Anesthesia Type:General  Level of Consciousness: drowsy  Airway & Oxygen Therapy: Patient Spontanous Breathing and Patient connected to face mask oxygen  Post-op Assessment: Report given to RN and Post -op Vital signs reviewed and stable  Post vital signs: Reviewed and stable  Last Vitals:  Vitals Value Taken Time  BP 113/64 11/20/2018 11:53 AM  Temp    Pulse 88 11/20/2018 11:58 AM  Resp 11 11/20/2018 11:58 AM  SpO2 100 % 11/20/2018 11:58 AM  Vitals shown include unvalidated device data.  Last Pain:  Vitals:   11/20/18 0659  TempSrc: Tympanic  PainSc: 0-No pain         Complications: No apparent anesthesia complications

## 2018-11-20 NOTE — Progress Notes (Signed)
   11/20/18 1800  Clinical Encounter Type  Visited With Family  Visit Type Initial  Referral From Chaplain;Nurse  Consult/Referral To Chaplain  Spiritual Encounters  Spiritual Needs Literature  Chaplain received OR for AD. Chaplain went to patient room and patient was asleep. Chaplain introduce herself to patient family member. Chaplain ask family member was patient needed an AD and family member stated that her and patient had talked about it. Chaplain asked where there any concerns or questions they had. Patient family member said no. Chaplain advice family member to called if they would like to proceed with the completion. Family member said ok thank you and have a bless night and Chaplain said thank you and you as well.

## 2018-11-20 NOTE — Consult Note (Signed)
Name: Alicia Whitney MRN: [id number] DOB: 15-May-1986    ADMISSION DATE:  11/20/2018 CONSULTATION DATE: 11/20/2018  REFERRING MD : Dr. Thelma Barge   BRIEF PATIENT DESCRIPTION:  33 yo female admitted with left upper and left lower lobe mass s/p left thoracotomy and wedge resection findings consistent with scarring of left upper lobe and granulomatous inflammation of left lower lobe   SIGNIFICANT EVENTS  01/16-Pt admitted to ICU post left thoracotomy and wedge resection   HISTORY OF PRESENT ILLNESS:   This is a 33 yo female with a PMH of Seizures, Depression, Bipolar Disorder, and Anxiety. She began experiencing substernal chest pain and shortness of breath that started on 10/2018, therefore she proceeded to the ER for evaluation.  CTA Chest revealed nodular lesions in the lingula and left lower lobe with the largest lesion in the posterior segment left lower lobe measuring 1.8 x 1.5 x 1.4 cm.  She was referred to Oncologist Dr. Orlie Dakin who recommended a PET scan.  Pet scan performed 10/23/2018 which did not reveal any obvious uptake outside of the left lower lobe nodule which had minimal SUV uptake of 3.1; however low-grade metabolic activity could not exclude malignancy.  Due to increasing pulmonary nodules and concern of possible malignancy Cardiothoracic Surgeon Dr. Thelma Barge recommended left thoracotomy for additional workup. Therefore, she presented to [facility] on 11/20/2018 for a left thoracotomy and wedge resection.  Findings were consistent  with scarring of left upper lobe and granulomatous inflammation of left lower lobe.  She was subsequently admitted to ICU post procedure for additional workup and treatment.     PAST MEDICAL HISTORY :   has a past medical history of Anxiety, Bipolar disorder (HCC), Depression, and Seizure (HCC).  has a past surgical history that includes Tubal ligation; Tonsillectomy; Dilation and curettage of uterus; and Cesarean section. Prior to Admission medications     Medication Sig Start Date End Date Taking? Authorizing Provider  hydrOXYzine (ATARAX/VISTARIL) 25 MG tablet Take 1 tablet (25 mg total) by mouth every 8 (eight) hours as needed. Patient taking differently: Take 25 mg by mouth every 8 (eight) hours as needed for anxiety.  01/11/17  Yes Jimmy Footman, MD  lamoTRIgine (LAMICTAL) 25 MG tablet Take 50 mg by mouth at bedtime.   Yes [provider]  sertraline (ZOLOFT) 50 MG tablet Take 1 tablet (50 mg total) by mouth at bedtime. 01/11/17  Yes Jimmy Footman, MD  traZODone (DESYREL) 150 MG tablet Take 75 mg by mouth at bedtime.   Yes [provider]   Allergies  Allergen Reactions  . Morphine And Related Nausea And Vomiting    Does not work for pt either  . Onion Swelling and Other (See Comments)    Throat swelling  . Latex Rash    FAMILY HISTORY:  family history includes Cancer in her maternal aunt. SOCIAL HISTORY:  reports that she quit smoking about 8 years ago. Her smoking use included cigarettes. She has a 0.25 pack-year smoking history. She has never used smokeless tobacco. She reports previous alcohol use. She reports previous drug use.  REVIEW OF SYSTEMS: Positives in BOLD  Constitutional: Negative for fever, chills, weight loss, malaise/fatigue and diaphoresis.  HENT: Negative for hearing loss, ear pain, nosebleeds, congestion, sore throat, neck pain, tinnitus and ear discharge.   Eyes: Negative for blurred vision, double vision, photophobia, pain, discharge and redness.  Respiratory: Negative for cough, hemoptysis, sputum production, shortness of breath, wheezing and stridor.   Cardiovascular: left sided chest pain at chest tube  incision sites, palpitations, orthopnea, claudication, leg swelling and PND.  Gastrointestinal: Negative for heartburn, nausea, vomiting, abdominal pain, diarrhea, constipation, blood in stool and melena.  Genitourinary: Negative for dysuria, urgency, frequency,  hematuria and flank pain.  Musculoskeletal: Negative for myalgias, back pain, joint pain and falls.  Skin: Negative for itching and rash.  Neurological: Negative for dizziness, tingling, tremors, sensory change, speech change, focal weakness, seizures, loss of consciousness, weakness and headaches.  Endo/Heme/Allergies: Negative for environmental allergies and polydipsia. Does not bruise/bleed easily.  SUBJECTIVE:  c/o left sided chest pain at chest tube site   VITAL SIGNS: Temp:  [97.9 F (36.6 C)-99 F (37.2 C)] 97.9 F (36.6 C) (01/16 1200) Pulse Rate:  [85-100] 100 (01/16 1209) Resp:  [11-18] 17 (01/16 1209) BP: (113-126)/(64-77) 126/76 (01/16 1209) SpO2:  [97 %-100 %] 100 % (01/16 1209) Weight:  [79.6 kg] 79.6 kg (01/16 0659)  PHYSICAL EXAMINATION: General: well developed, well nourished female, NAD on RA  Neuro: alert and oriented, follows commands HEENT: supple, no JVD  Cardiovascular: sinus tach, no R/G Lungs: diminished with shallow respirations left greater than right, even, non labored; 28 Fr left sided Y connected chest tube present at the left apex of the chest and left paravertebral space with serosanguinous drainage   Abdomen: +BS x4, soft, non tender, non distended  Musculoskeletal: normal bulk and tone, no edema  Skin: 2 left sided chest tube incisions with honeycomb dressing intact with old drainage  Recent Labs  Lab 11/20/18 0640  NA 137  K 3.2*  CL 107  CO2 24  BUN 10  CREATININE 0.92  GLUCOSE 109*   Recent Labs  Lab 11/20/18 0640  HGB 12.5  HCT 37.4  WBC 7.2  PLT 198   No results found.  ASSESSMENT / PLAN:  s/p Left thoracotomy and wedge resection of left upper and left lower lobe lesions (findings consistent with scarring of left upper lobe and granulomatous inflammation of left lower lobe) 11/20/2018 Postop pain  Supplemental O2 for dyspnea and/or hypoxia  Scheduled bronchodilator therapy Chest tube to chest drainage system: suction at  20 cm  Pulmonary hygiene Repeat CXR in am  Follow microbiology results  Continue cefazolin x2 more doses for surgical prophylaxis  Continue morphine, oxycodone, and tramadol for pain management  Hypokalemia  Trend BMP  Replace electrolytes as indicated  Monitor UOP Continuous telemetry monitoring  Bipolar Disorder and Depression  Continue outpatient sertraline    Sonda Rumbleana Taylan Mayhan, AGNP  Pulmonary/Critical Care Pager [phone] (please enter [phone] digits) PCCM Consult Pager [phone] (please enter 7 digits)

## 2018-11-20 NOTE — Anesthesia Preprocedure Evaluation (Signed)
Anesthesia Evaluation  Patient identified by MRN, date of birth, ID band Patient awake    Reviewed: Allergy & Precautions, NPO status , Patient's Chart, lab work & pertinent test results  History of Anesthesia Complications Negative for: history of anesthetic complications  Airway Mallampati: II  TM Distance: >3 FB Neck ROM: Full    Dental  (+) Upper Dentures, Edentulous Lower   Pulmonary neg sleep apnea, neg COPD, former smoker,    breath sounds clear to auscultation- rhonchi (-) wheezing      Cardiovascular Exercise Tolerance: Good (-) hypertension(-) CAD, (-) Past MI, (-) Cardiac Stents and (-) CABG  Rhythm:Regular Rate:Normal - Systolic murmurs and - Diastolic murmurs    Neuro/Psych Seizures - (no seizures in several years and not on antiepileptic medication),  PSYCHIATRIC DISORDERS Anxiety Depression Bipolar Disorder    GI/Hepatic negative GI ROS, Neg liver ROS,   Endo/Other  negative endocrine ROSneg diabetes  Renal/GU negative Renal ROS     Musculoskeletal negative musculoskeletal ROS (+)   Abdominal (+) - obese,   Peds  Hematology negative hematology ROS (+)   Anesthesia Other Findings Past Medical History: No date: Anxiety No date: Bipolar disorder (HCC) No date: Depression No date: Seizure [facility])     Comment:  last around age 26   Reproductive/Obstetrics                             Anesthesia Physical Anesthesia Plan  ASA: II  Anesthesia Plan: General   Post-op Pain Management:    Induction: Intravenous  PONV Risk Score and Plan: 2 and Ondansetron, Dexamethasone and Midazolam  Airway Management Planned: Oral ETT and Double Lumen EBT  Additional Equipment:   Intra-op Plan:   Post-operative Plan: Extubation in OR  Informed Consent: I have reviewed the patients History and Physical, chart, labs and discussed the procedure including the risks, benefits and  alternatives for the proposed anesthesia with the patient or authorized representative who has indicated his/her understanding and acceptance.     Dental advisory given  Plan Discussed with: CRNA and Anesthesiologist  Anesthesia Plan Comments:         Anesthesia Quick Evaluation

## 2018-11-20 NOTE — Anesthesia Post-op Follow-up Note (Signed)
Anesthesia QCDR form completed.        

## 2018-11-20 NOTE — Op Note (Signed)
11/20/2018  11:53 AM  PATIENT:  Alicia Whitney  33 y.o. female  PRE-OPERATIVE DIAGNOSIS: Left upper and left lower lobe mass  POST-OPERATIVE DIAGNOSIS: Frozen section consistent with scar left upper lobe and consistent with granulomatous inflammation left lower lobe  PROCEDURE: Preoperative bronchoscopy with left thoracotomy and wedge resection of left upper and left lower lobe lesions  SURGEON:  Surgeon(s) and Role:    * Hulda Marinaks, Armani Gawlik, MD - Primary    * Henrene DodgePiscoya, Jose, MD - Assisting  ASSISTANTS: Eulis ManlyMorgan [facility] PAS  ANESTHESIA: General  INDICATIONS FOR PROCEDURE this patient is a 33 year old white female with an enlarging left lower lobe mass most consistent with a malignancy on preoperative evaluation.  She was offered the above named procedure for definitive diagnosis and treatment.  The indications and risks were explained the patient gave her informed consent.  DICTATION: Patient was brought to the operating suite and placed in the supine position.  General endotracheal anesthesia was given through a double-lumen tube.  Preoperative bronchoscopy was carried out.  There is no evidence of endobronchial tumor.  There is no evidence of purulent secretions.  Patient was positioned for a left thoracotomy.  All pressure points carefully padded.  The patient was prepped and draped in usual sterile fashion.  A posterior lateral fifth interspace thoracotomy was performed.  The latissimus was divided.  The serratus was spared.  Upon entering the chest we initially freed up the inferior pulmonary ligament of the pulmonary vein.  Careful palpation of the lung then revealed a single lesion in the left upper lobe measuring less than 1 cm in size.  There were no other lesions identified in the left upper lobe.  In the left lower lobe just above the diaphragm there appeared to be a solid mass consistent with a lung cancer.  Because a diagnosis had not been established on either lesion both lesions were  resected from the respective lobes using multiple firings of an endoscopic stapler.  Both specimens were sent to pathology for frozen section.  In addition there was approximately 40 cc of clear yellowish fluid present within the pleural space upon entering.  Because the lower lobe lesion appeared to be involving the visceral pleura we suctioned this and sent this for analysis as well.  The frozen section on both the upper and lower lobes were the consistent with benign processes.  We therefore sent the pleural fluid for mycobacterial and fungal cultures.  While we are waiting for the frozen sections we did dissected out the superior segmental arterial branch as well as the basilar branches.  We did open the inferior pulmonary ligament up to the level of the inferior pulmonary vein.  Once the diagnosis was established we then stopped and pursued no further lung resection.  2 chest tubes were inserted in standard fashion.  An straight 28 French chest tube was positioned to the apex of the chest and an angled chest tube was positioned on the paravertebral space.  These were brought out through separate stab wounds inferiorly.  They were secured to the skin with silk sutures.  The chest was then closed after assuring hemostasis.  #2 Vicryl pericostal sutures were used to approximate the ribs.  The serratus muscles allowed to return to its normal anatomic position.  The latissimus was closed with #2 Vicryl.  Subcutaneous tissues with 2-0 Vicryl and the skin with skin clips.  The patient was then extubated and taken to the recovery room in stable condition.   Hulda Marinimothy Everlynn Sagun, MD

## 2018-11-20 NOTE — Interval H&P Note (Signed)
History and Physical Interval Note:  11/20/2018 7:18 AM  Alicia Whitney  has presented today for surgery, with the diagnosis of LUNG MASS  The various methods of treatment have been discussed with the patient and family. After consideration of risks, benefits and other options for treatment, the patient has consented to  Procedure(s): PREOP BRONCHOSCOPY, THORACOTOMY AND LUNG RESECTION (Left) as a surgical intervention .  The patient's history has been reviewed, patient examined, no change in status, stable for surgery.  I have reviewed the patient's chart and labs.  Questions were answered to the patient's satisfaction.     Hulda Marin

## 2018-11-21 ENCOUNTER — Encounter: Payer: Self-pay | Admitting: Cardiothoracic Surgery

## 2018-11-21 ENCOUNTER — Inpatient Hospital Stay: Payer: Self-pay

## 2018-11-21 LAB — CBC
HEMATOCRIT: 35.1 % — AB (ref 36.0–46.0)
Hemoglobin: 11.6 g/dL — ABNORMAL LOW (ref 12.0–15.0)
MCH: 29.7 pg (ref 26.0–34.0)
MCHC: 33 g/dL (ref 30.0–36.0)
MCV: 90 fL (ref 80.0–100.0)
Platelets: 190 10*3/uL (ref 150–400)
RBC: 3.9 MIL/uL (ref 3.87–5.11)
RDW: 12.4 % (ref 11.5–15.5)
WBC: 11.8 10*3/uL — ABNORMAL HIGH (ref 4.0–10.5)
nRBC: 0 % (ref 0.0–0.2)

## 2018-11-21 LAB — BPAM RBC
BLOOD PRODUCT EXPIRATION DATE: 202002062359
BLOOD PRODUCT EXPIRATION DATE: 202002062359
Unit Type and Rh: 6200
Unit Type and Rh: 6200

## 2018-11-21 LAB — TYPE AND SCREEN
ABO/RH(D): AB POS
Antibody Screen: NEGATIVE
Unit division: 0
Unit division: 0

## 2018-11-21 LAB — BASIC METABOLIC PANEL
Anion gap: 6 (ref 5–15)
BUN: 7 mg/dL (ref 6–20)
CALCIUM: 8.4 mg/dL — AB (ref 8.9–10.3)
CO2: 25 mmol/L (ref 22–32)
Chloride: 107 mmol/L (ref 98–111)
Creatinine, Ser: 0.79 mg/dL (ref 0.44–1.00)
GFR calc non Af Amer: >60 mL/min (ref >60)
Glucose, Bld: 126 mg/dL — ABNORMAL HIGH (ref 70–99)
Potassium: 3.6 mmol/L (ref 3.5–5.1)
Sodium: 138 mmol/L (ref 135–145)

## 2018-11-21 LAB — PREPARE RBC (CROSSMATCH)

## 2018-11-21 MED ORDER — KETOROLAC TROMETHAMINE 30 MG/ML IJ SOLN
15.0000 mg | Freq: Three times a day (TID) | INTRAMUSCULAR | Status: AC | PRN
  Administered 2018-11-21 – 2018-11-23 (×6): 15 mg via INTRAVENOUS
  Filled 2018-11-21 (×6): qty 1

## 2018-11-21 MED ORDER — TRAMADOL HCL 50 MG PO TABS
100.0000 mg | ORAL_TABLET | Freq: Four times a day (QID) | ORAL | Status: DC
  Administered 2018-11-21 – 2018-11-25 (×15): 100 mg via ORAL
  Filled 2018-11-21 (×18): qty 2

## 2018-11-21 MED ORDER — ALBUTEROL SULFATE (2.5 MG/3ML) 0.083% IN NEBU
2.5000 mg | INHALATION_SOLUTION | Freq: Four times a day (QID) | RESPIRATORY_TRACT | Status: DC
  Administered 2018-11-21 – 2018-11-22 (×2): 2.5 mg via RESPIRATORY_TRACT
  Filled 2018-11-21 (×2): qty 3

## 2018-11-21 MED ORDER — METHOCARBAMOL 500 MG PO TABS
500.0000 mg | ORAL_TABLET | Freq: Four times a day (QID) | ORAL | Status: DC
  Administered 2018-11-21 – 2018-11-25 (×17): 500 mg via ORAL
  Filled 2018-11-21 (×21): qty 1

## 2018-11-21 MED ORDER — ALBUTEROL SULFATE (2.5 MG/3ML) 0.083% IN NEBU: 2.5000 mg | INHALATION_SOLUTION | RESPIRATORY_TRACT | Status: DC | PRN

## 2018-11-21 NOTE — Anesthesia Postprocedure Evaluation (Signed)
Anesthesia Post Note  Patient: Alicia Whitney  Procedure(s) Performed: PREOP BRONCHOSCOPY, THORACOTOMY AND LUNG RESECTION (Left )  Patient location during evaluation: ICU Anesthesia Type: General Level of consciousness: awake and alert and oriented Pain management: pain level controlled Vital Signs Assessment: post-procedure vital signs reviewed and stable Respiratory status: spontaneous breathing and nonlabored ventilation Cardiovascular status: stable Anesthetic complications: no     Last Vitals:  Vitals:   11/21/18 0416 11/21/18 0500  BP:  116/81  Pulse: 98 99  Resp: 13 13  Temp:    SpO2: 94% 93%    Last Pain:  Vitals:   11/21/18 0615  TempSrc:   PainSc: Stephan Minister

## 2018-11-21 NOTE — Evaluation (Signed)
Physical Therapy Evaluation Patient Details Name: Alicia Whitney MRN: [id number] DOB: 01-Jan-1986 Today's Date: 11/21/2018   History of Present Illness  33 yo female admitted with left upper and left lower lobe mass s/p left thoracotomy and wedge resection findings consistent with scarring of left upper lobe and granulomatous inflammation of left lower lobe. PMH of Seizures, Depression, Bipolar Disorder, and Anxiety.  Clinical Impression  Patient with complaints of L sided rib/chest pain 8/10 at start of session, premedicated. Pt alert, but anxious and tearful throughout session due to significant levels of pain. Pt reported living with her husband in a first floor apartment, previously independent, works, drives.  Pt able to move all extremities with good range and strength (the exception of L shoulder due to increased L sided pain). Instructed in rolling and sidelying to sit to minimize L sided pain, verbal/visual cues needed, minAx1 for LE management. Pt was able to sit EOB for several minutes, instructed in PLB due to anxiety and mild drop in spO2 reading, returned >90% in 1-37minutes. Pt was able to perform some seated LE exercises as well. Sit <>stand with handheld assist. Took small, shuffling steps towards head of bed with intermittent use of RW and handheld assist. Pt returned to bed due to pain levels, repositioned minAx1.  Overall the patient demonstrated deficits (see "PT Problem List") that impede the patient's functional abilities, safety, and mobility and would benefit from skilled PT intervention. Recommendation is HHPT with supervision/assistance, pt believed either her mother/husband would be able to provide this care.     Follow Up Recommendations Home health PT;Supervision/Assistance - 24 hour    Equipment Recommendations  Rolling walker with 5" wheels    Recommendations for Other Services       Precautions / Restrictions Precautions Precautions: Fall Restrictions Weight  Bearing Restrictions: No      Mobility  Bed Mobility Overal bed mobility: Needs Assistance Bed Mobility: Rolling;Sidelying to Sit;Sit to Supine Rolling: Min guard;Min assist Sidelying to sit: Min guard;Min assist   Sit to supine: Min assist   General bed mobility comments: verbal cues for all mobility for sequencing to minimize pain. Pt needed intermittent assistance for LE management due to pain, not weakness.  Transfers Overall transfer level: Needs assistance Equipment used: 1 person hand held assist Transfers: Sit to/from Stand Sit to Stand: Min guard;Min assist;+2 safety/equipment            Ambulation/Gait Ambulation/Gait assistance: Min guard;Min assist;+2 safety/equipment Gait Distance (Feet): 1 Feet Assistive device: 1 person hand held assist       General Gait Details: Pt able to take small shuffling steps towards head of bed, mobility limited this session due to pts significantly increased pain.  Stairs            Wheelchair Mobility    Modified Rankin (Stroke Patients Only)       Balance Overall balance assessment: Needs assistance Sitting-balance support: Feet supported Sitting balance-Leahy Scale: Fair     Standing balance support: Single extremity supported Standing balance-Leahy Scale: Fair                               Pertinent Vitals/Pain Pain Assessment: 0-10 Pain Score: 8  Pain Location: L chest/thoracic area Pain Descriptors / Indicators: Aching;Guarding;Grimacing;Crying Pain Intervention(s): Limited activity within patient's tolerance;Monitored during session;RN gave pain meds during session;Premedicated before session;Repositioned;Utilized relaxation techniques    Home Living Family/patient expects to be discharged to:: Private residence Living  Arrangements: Spouse/significant other Available Help at Discharge: Family;Friend(s);Available 24 hours/day;Available PRN/intermittently Type of Home: Apartment Home  Access: Level entry     Home Layout: One level Home Equipment: Shower seat - built in      Prior Function Level of Independence: Independent         Comments: works Engineering ge[employer], 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, no falls in teh last 6 months, drives, no AD at baseline     Hand Dominance        Extremity/Trunk Assessment   Upper Extremity Assessment Upper Extremity Assessment: Overall WFL for tasks assessed    Lower Extremity Assessment Lower Extremity Assessment: RLE deficits/detail;LLE deficits/detail RLE Deficits / Details: grossly 4/5 LLE Deficits / Details: grossly 4/5       Communication   Communication: No difficulties  Cognition Arousal/Alertness: Awake/alert Behavior During Therapy: WFL for tasks assessed/performed Overall Cognitive Status: Within Functional Limits for tasks assessed                                        General Comments      Exercises Other Exercises Other Exercises: Pt able to sit EOB for several minutes to allow time to adjust to position, PLB for pain/spO2. able to maintain with supervision, no UE support.   Assessment/Plan    PT Assessment Patient needs continued PT services  PT Problem List Decreased strength;Pain;Decreased range of motion;Decreased activity tolerance;Decreased balance;Decreased mobility       PT Treatment Interventions DME instruction;Therapeutic exercise;Gait training;Balance training;Neuromuscular re-education;Functional mobility training;Patient/family education    PT Goals (Current goals can be found in the Care Plan section)  Acute Rehab PT Goals Patient Stated Goal: to go home PT Goal Formulation: With patient Time For Goal Achievement: 12/05/18 Potential to Achieve Goals: Good    Frequency Min 2X/week   Barriers to discharge        Co-evaluation               AM-PAC PT "6 Clicks" Mobility  Outcome Measure Help needed turning from your back to your side while in a flat bed without  using bedrails?: A Little Help needed moving from lying on your back to sitting on the side of a flat bed without using bedrails?: A Little Help needed moving to and from a bed to a chair (including a wheelchair)?: A Little Help needed standing up from a chair using your arms (e.g., wheelchair or bedside chair)?: A Little Help needed to walk in hospital room?: A Little Help needed climbing 3-5 steps with a railing? : A Lot 6 Click Score: 17    End of Session Equipment Utilized During Treatment: Other (comment)(chest tube on water seal by RN, RN in room to monitor ) Activity Tolerance: Patient limited by pain Patient left: in bed;with nursing/sitter in room;with family/visitor present;with call bell/phone within reach;with SCD's reapplied Nurse Communication: Mobility status PT Visit Diagnosis: Unsteadiness on feet (R26.81);Pain;Difficulty in walking, not elsewhere classified (R26.2) Pain - Right/Left: Left Pain - part of body: Shoulder(ribs, thoracic area)    Time: 9507-2257 PT Time Calculation (min) (ACUTE ONLY): 39 min   Charges:   PT Evaluation $PT Eval Low Complexity: 1 Low PT Treatments $Therapeutic Exercise: 8-22 mins $Therapeutic Activity: 8-22 mins        Olga Coaster PT, DPT 10:50 AM,11/21/18 [phone]

## 2018-11-21 NOTE — Progress Notes (Signed)
[facility] SURGICAL PROGRESS NOTE  Hospital Day(s): 1.   Post op day(s): 1 Day Post-Op.   Interval History: Patient seen and examined, no acute events or new complaints overnight. Patient reports some lateral left chest pain with movement and deep inspiration. No fever, chills, nausea, emesis, or SOB. No air leak appreciable on pleurovac today. Intermittently tachycardic. Working with physical therapy.   Review of Systems:  Constitutional: denies fever, chills  Respiratory: denies any shortness of breath  Cardiovascular: denies chest pain or palpitations  Gastrointestinal: Denied N/V, or diarrhea/and bowel function as per interval history Integumentary: denies any other rashes or skin discolorations except surgical incision to left chest   Vital signs in last 24 hours: [min-max] current  Temp:  [97.6 F (36.4 C)-99.6 F (37.6 C)] 98.9 F (37.2 C) (01/17 0800) Pulse Rate:  [97-110] 101 (01/17 1000) Resp:  [12-21] 15 (01/17 1000) BP: (116-132)/(75-99) 125/86 (01/17 1000) SpO2:  [93 %-100 %] 96 % (01/17 1000) Weight:  [83 kg] 83 kg (01/16 1258)     Height: 5\' 6"  (167.6 cm) Weight: 83 kg BMI (Calculated): 29.53   Intake/Output this shift:  Total I/O In: 330.3 [I.V.:330.3] Out: -    Intake/Output last 2 shifts:  @IOLAST2SHIFTS @   Physical Exam:  Constitutional: alert, cooperative and no distress  Respiratory: breathing non-labored at rest, slightly diminished breath sounds on the left.  Chest: Thoracotomy incison is CDI, no erythema or drainage.  Cardiovascular: Tachycardic, regular rhythm, no m/r/g   Labs:  CBC Latest Ref Rng & Units 11/21/2018 11/20/2018 11/11/2018  WBC 4.0 - 10.5 K/uL 11.8(H) 7.2 7.2  Hemoglobin 12.0 - 15.0 g/dL 11.6(L) 12.5 13.1  Hematocrit 36.0 - 46.0 % 35.1(L) 37.4 39.0  Platelets 150 - 400 K/uL 190 198 206   CMP Latest Ref Rng & Units 11/21/2018 11/20/2018 11/11/2018  Glucose 70 - 99 mg/dL 389(H) 734(K) 876(O)  BUN 6 - 20 mg/dL 7 10 13    Creatinine 0.44 - 1.00 mg/dL 1.15 7.26 2.03  Sodium 135 - 145 mmol/L 138 137 139  Potassium 3.5 - 5.1 mmol/L 3.6 3.2(L) 3.6  Chloride 98 - 111 mmol/L 107 107 108  CO2 22 - 32 mmol/L 25 24 24   Calcium 8.9 - 10.3 mg/dL 5.5(H) 7.4(B) 9.2  Total Protein 6.5 - 8.1 g/dL - 6.6 7.4  Total Bilirubin 0.3 - 1.2 mg/dL - 0.4 0.9  Alkaline Phos 38 - 126 U/L - 88 92  AST 15 - 41 U/L - 18 17  ALT 0 - 44 U/L - 15 13     Imaging studies:   -- CXR on 11/21/2018 personally reviewed and radiologist report reviewed:  IMPRESSION: Postoperative changes on the left with stable position of left chest tubes. No visible pneumothorax.   Assessment/Plan:  33 y.o. female who is doing well with mild tachycardia who is 1 Day Post-Op s/p left thoracotomy and left upper and lower lobe wedge resection for lung nodules, complicated by pertinent comorbidities including anxiety, bipolar, depression, history of seizure, and obesity.   - Okay to transfer to floor bed today  - Chest tube to remain to suction today (20 cm of water)   - Morning CXR  - Pain control as needed  - Aggressive pulmonary toilet.   All of the above findings and recommendations were discussed with the patient, patient's family, and the medical team, and all of patient's and her family's questions were answered to their expressed satisfaction.  -- Lynden Oxford, PA-C [facility] 11/21/2018, 12:12 PM  [phone] M-F: 7am - 4pm

## 2018-11-22 MED ORDER — ALBUTEROL SULFATE (2.5 MG/3ML) 0.083% IN NEBU
2.5000 mg | INHALATION_SOLUTION | Freq: Three times a day (TID) | RESPIRATORY_TRACT | Status: DC
  Administered 2018-11-22 – 2018-11-25 (×8): 2.5 mg via RESPIRATORY_TRACT
  Filled 2018-11-22 (×10): qty 3

## 2018-11-22 NOTE — Progress Notes (Signed)
Patient ID: Alicia Whitney, female   DOB: [birth year], 33 y.o.   MRN: [id number]  Overall has had a better day yesterday.  She states that her pain is under good control with the Percocet and tramadol.  Her appetite has been good.  Her chest tubes are only draining some serous fluid.  There is no obvious air leak.  Her lungs show diminished breath sounds bilaterally with poor deep inspiratory effort.  Her heart is regular.  We will remove her Foley catheter.  We will discontinue her morphine as she states that she is a longer taking that.  I will encourage her to be out of bed and ambulate in the halls.  She may have her chest tubes placed to waterseal during ambulation.  We will change all of her dressings today.

## 2018-11-23 NOTE — Progress Notes (Signed)
PT Cancellation Note  Patient Details Name: Alicia Whitney MRN: [id number] DOB: [birth year]   Cancelled Treatment:    Reason Eval/Treat Not Completed: Patient at procedure or test/unavailable.  Pt currently on bedpan.  Will re-attempt shortly.  [name], PT, DPT  [name] 11/23/2018, 1:57 PM

## 2018-11-23 NOTE — Progress Notes (Signed)
Subjective:  CC:  Alicia Whitney is a 33 y.o. female  Hospital stay day 3, 3 Days Post-Op  s/p left thoracotomy and left upper and lower lobe wedge resection for lung nodules,  HPI: No issues overnight.  No active complaints.  ROS:  General: Denies weight loss, weight gain, fatigue, fevers, chills, and night sweats. Heart: Denies chest pain, palpitations, racing heart, irregular heartbeat, leg pain or swelling, and decreased activity tolerance. Respiratory: Denies breathing difficulty, shortness of breath, wheezing, cough, and sputum. GI: Denies change in appetite, heartburn, nausea, vomiting, constipation, diarrhea, and blood in stool. GU: Denies difficulty urinating, pain with urinating, urgency, frequency, blood in urine.   Objective:      Temp:  [97.8 F (36.6 C)-98.6 F (37 C)] 98.6 F (37 C) (01/19 1538) Pulse Rate:  [87-99] 92 (01/19 1538) Resp:  [15-19] 19 (01/19 1538) BP: (102-112)/(64-76) 110/76 (01/19 1538) SpO2:  [92 %-96 %] 93 % (01/19 1538)     Height: 5\' 6"  (167.6 cm) Weight: 83 kg BMI (Calculated): 29.53   Intake/Output this shift:   Intake/Output Summary (Last 24 hours) at 11/23/2018 2053 Last data filed at 11/23/2018 1843 Gross per 24 hour  Intake 360 ml  Output 650 ml  Net -290 ml        Constitutional :  alert, cooperative, appears stated age and no distress  Respiratory:  clear to auscultation bilaterally.  Chest tube site clean dry and intact.  No air leak noted.  Cardiovascular:  regular rate and rhythm  Gastrointestinal: soft, non-tender; bowel sounds normal; no masses,  no organomegaly.   Skin: Cool and moist.  Psychiatric: Normal affect, non-agitated, not confused       LABS:  CMP Latest Ref Rng & Units 11/21/2018 11/20/2018 11/11/2018  Glucose 70 - 99 mg/dL 465(K) 354(S) 568(L)  BUN 6 - 20 mg/dL 7 10 13   Creatinine 0.44 - 1.00 mg/dL 2.75 1.70 0.17  Sodium 135 - 145 mmol/L 138 137 139  Potassium 3.5 - 5.1 mmol/L 3.6 3.2(L) 3.6  Chloride 98  - 111 mmol/L 107 107 108  CO2 22 - 32 mmol/L 25 24 24   Calcium 8.9 - 10.3 mg/dL 4.9(S) 4.9(Q) 9.2  Total Protein 6.5 - 8.1 g/dL - 6.6 7.4  Total Bilirubin 0.3 - 1.2 mg/dL - 0.4 0.9  Alkaline Phos 38 - 126 U/L - 88 92  AST 15 - 41 U/L - 18 17  ALT 0 - 44 U/L - 15 13   CBC Latest Ref Rng & Units 11/21/2018 11/20/2018 11/11/2018  WBC 4.0 - 10.5 K/uL 11.8(H) 7.2 7.2  Hemoglobin 12.0 - 15.0 g/dL 11.6(L) 12.5 13.1  Hematocrit 36.0 - 46.0 % 35.1(L) 37.4 39.0  Platelets 150 - 400 K/uL 190 198 206    RADS: n/a Assessment:    s/p left thoracotomy and left upper and lower lobe wedge resection for lung nodules.  Clinically stable, continue present management.

## 2018-11-23 NOTE — Progress Notes (Signed)
Physical Therapy Treatment Patient Details Name: Alicia Whitney MRN: [id number] DOB: 27-Jun-1986 Today's Date: 11/23/2018    History of Present Illness 33 yo female admitted with left upper and left lower lobe mass s/p left thoracotomy and wedge resection findings consistent with scarring of left upper lobe and granulomatous inflammation of left lower lobe. PMH of Seizures, Depression, Bipolar Disorder, and Anxiety.    PT Comments    Patient ready to work with therapy today and open to all education.  PT instructed pt in log roll technique to get to EOB and pt stated that her pain was significantly less with this technique.  Pt able to sit at EOB with good balance and able to stand but reporting nausea when standing.  She did not present as hypotensive but became pale and ill in appearance.  PT assisted pt back to bed and pt was able to tolerate supine there ex.  PT educated pt regarding HEP and provided and handout.  Pt required min A to complete there ex.  PT will continue to benefit from skilled PT with focus on strength, tolerance to activity and safe functional mobility.   Follow Up Recommendations  Home health PT;Supervision/Assistance - 24 hour     Equipment Recommendations  Rolling walker with 5" wheels    Recommendations for Other Services       Precautions / Restrictions Precautions Precautions: Fall Restrictions Weight Bearing Restrictions: No    Mobility  Bed Mobility Overal bed mobility: Needs Assistance Bed Mobility: Rolling;Sidelying to Sit;Sit to Sidelying Rolling: Min guard;Min assist Sidelying to sit: Min guard;Min assist     Sit to sidelying: Min assist General bed mobility comments: Education regarding log rolling to R side and assistance with LE's when returning to bed.  Transfers Overall transfer level: Needs assistance Equipment used: 1 person hand held assist Transfers: Sit to/from Stand Sit to Stand: Min guard;Min assist;+2 safety/equipment          General transfer comment: Able to stand very slowly, reported nausea, weakness and some dizziness.  Systolic BP did drop 10 mm/Hg.  Pt appearing pale and unwell and requested to return to bed.  Ambulation/Gait Ambulation/Gait assistance: (deferred)               Stairs             Wheelchair Mobility    Modified Rankin (Stroke Patients Only)       Balance Overall balance assessment: Needs assistance Sitting-balance support: Feet supported Sitting balance-Leahy Scale: Fair     Standing balance support: Bilateral upper extremity supported Standing balance-Leahy Scale: Fair                              Cognition Arousal/Alertness: Lethargic Behavior During Therapy: WFL for tasks assessed/performed Overall Cognitive Status: Within Functional Limits for tasks assessed                                        Exercises General Exercises - Lower Extremity Ankle Circles/Pumps: 10 reps;Strengthening;Supine Short Arc Quad: Strengthening;Both;Supine;10 reps Heel Slides: Strengthening;Both;10 reps;Supine Hip ABduction/ADduction: Strengthening;Both;10 reps Other Exercises Other Exercises: time taken to review HEP x5 min    General Comments        Pertinent Vitals/Pain Pain Assessment: Faces Faces Pain Scale: Hurts even more Pain Descriptors / Indicators: Grimacing Pain Intervention(s): Limited activity within patient's tolerance;Monitored  during session    Home Living                      Prior Function            PT Goals (current goals can now be found in the care plan section) Acute Rehab PT Goals Patient Stated Goal: to go home PT Goal Formulation: With patient Time For Goal Achievement: 12/05/18 Potential to Achieve Goals: Good Progress towards PT goals: Progressing toward goals    Frequency    Min 2X/week      PT Plan      Co-evaluation              AM-PAC PT "6 Clicks" Mobility   Outcome  Measure  Help needed turning from your back to your side while in a flat bed without using bedrails?: A Little Help needed moving from lying on your back to sitting on the side of a flat bed without using bedrails?: A Little Help needed moving to and from a bed to a chair (including a wheelchair)?: A Little Help needed standing up from a chair using your arms (e.g., wheelchair or bedside chair)?: A Little Help needed to walk in hospital room?: A Little Help needed climbing 3-5 steps with a railing? : A Lot 6 Click Score: 17    End of Session Equipment Utilized During Treatment: Other (comment)(chest tube on water seal by RN) Activity Tolerance: Patient limited by pain(Pt became nauseated when standing.) Patient left: in bed;with nursing/sitter in room;with family/visitor present;with call bell/phone within reach Nurse Communication: Mobility status PT Visit Diagnosis: Unsteadiness on feet (R26.81);Pain;Difficulty in walking, not elsewhere classified (R26.2) Pain - Right/Left: Left Pain - part of body: Shoulder(ribs, thoracic area)     Time: [id number] PT Time Calculation (min) (ACUTE ONLY): 28 min  Charges:  $Therapeutic Exercise: 8-22 mins $Therapeutic Activity: 8-22 mins                     Glenetta HewSarah Jourdain Whitney, PT, DPT   Glenetta HewSarah Addasyn Whitney 11/23/2018, 4:23 PM

## 2018-11-24 ENCOUNTER — Inpatient Hospital Stay: Payer: Self-pay

## 2018-11-24 LAB — URINALYSIS, ROUTINE W REFLEX MICROSCOPIC
Glucose, UA: NEGATIVE mg/dL
Ketones, ur: NEGATIVE mg/dL
Nitrite: NEGATIVE
Protein, ur: 100 mg/dL — AB
RBC / HPF: >50 RBC/hpf — ABNORMAL HIGH (ref 0–5)
Specific Gravity, Urine: 1.031 — ABNORMAL HIGH (ref 1.005–1.030)
WBC, UA: >50 WBC/hpf — ABNORMAL HIGH (ref 0–5)
pH: 5 (ref 5.0–8.0)

## 2018-11-24 MED ORDER — SULFAMETHOXAZOLE-TRIMETHOPRIM 800-160 MG PO TABS
1.0000 | ORAL_TABLET | Freq: Two times a day (BID) | ORAL | Status: DC
  Administered 2018-11-24 – 2018-11-25 (×3): 1 via ORAL
  Filled 2018-11-24 (×4): qty 1

## 2018-11-24 NOTE — Progress Notes (Signed)
Pt c/o burning during urination and urine appears cloudy and dark. MD Thelma Barge made aware, urinalysis and Urine culture ordered.

## 2018-11-24 NOTE — Care Management (Signed)
Barrier- still has chest tubes.  One removed today

## 2018-11-24 NOTE — Progress Notes (Signed)
PT Cancellation Note  Patient Details Name: Alicia Whitney MRN: [id number] DOB: 10/01/1986   Cancelled Treatment:    Reason Eval/Treat Not Completed: Other (comment).  PT consult noted to be cancelled.  Please re-consult PT if pt would benefit from skilled PT during hospitalization.  Hendricks Limes, PT 11/24/18, 2:33 PM [phone]

## 2018-11-24 NOTE — Progress Notes (Signed)
Patient ID: Alicia Whitney, female   DOB: 03-09-1986, 33 y.o.   MRN: [id number]  She states that her pain is better today.  Her appetite was pretty good yesterday although not as good as she would like.  She did get a little dizzy with ambulation.  Her thoracotomy wound is healing as expected.  There is no erythema or drainage.  Her lungs are actually equal and clear bilaterally.  Her heart is regular.  There is no air leak today.  Her chest tube put out 80 cc for the last 24 hours.  Her chest x-ray today shows a very small apical pneumothorax.  We will discontinue her cardiac monitor.  We will begin Bactrim for her UTI.  We will remove the posterior angled chest tube and leave the anterior chest tube in till tomorrow.  We will place her chest tubes to waterseal.  Pathology is still pending.

## 2018-11-25 ENCOUNTER — Inpatient Hospital Stay: Payer: Self-pay

## 2018-11-25 LAB — URINE CULTURE: Culture: <10000 — AB

## 2018-11-25 LAB — ACID FAST SMEAR (AFB, MYCOBACTERIA): Acid Fast Smear: NEGATIVE

## 2018-11-25 MED ORDER — TRAMADOL HCL 50 MG PO TABS: 100.0000 mg | ORAL_TABLET | Freq: Four times a day (QID) | ORAL | 0 refills | Status: DC

## 2018-11-25 MED ORDER — METHOCARBAMOL 500 MG PO TABS: 500.0000 mg | ORAL_TABLET | Freq: Four times a day (QID) | ORAL | 0 refills | Status: DC

## 2018-11-25 MED ORDER — SULFAMETHOXAZOLE-TRIMETHOPRIM 800-160 MG PO TABS: 1.0000 | ORAL_TABLET | Freq: Two times a day (BID) | ORAL | 0 refills | Status: AC

## 2018-11-25 MED ORDER — OXYCODONE-ACETAMINOPHEN 7.5-325 MG PO TABS: 1.0000 | ORAL_TABLET | ORAL | 0 refills | Status: DC | PRN

## 2018-11-25 NOTE — Progress Notes (Addendum)
Patient called me into the room and stated that she had some drainage on her shirt and underpants. After further inspection I could not find where the drainage had come from or if it was any. Patient gauze was dry and so was her Tegaderm and tubing connection was without drainage. Patient also stated that it felt like fluid was accumulating in her abdomen. Upon inspection of the tube and her abdomen I called Xray to see if they could come and do it a little earlier. Patient stated that she was not having difficulty breathing and that she was just in a little pain. Will continue to monitor.

## 2018-11-25 NOTE — Progress Notes (Signed)
Patient discharged this afternoon with mother. Chest tube removed prior to discharge. Patient and mother provided with prescription information and follow up appointments, as well as thoracotomy site after care education. No complaints at this time, VSS.

## 2018-11-25 NOTE — Progress Notes (Signed)
Patient ID: Alicia Whitney, female   DOB: 06-04-1986, 33 y.o.   MRN: [id number]  Overall had a pretty quiet night.  Her pain is under better control.  She is eating well.  She did walk in the halls yesterday with me and did well with that.  She is afebrile.  Her current temperature is 99.1.  Her wounds are clean dry and intact.  There is no drainage from the thoracotomy wound.  There is only a trace amount of serous drainage around the chest tube.  Her lungs are equal bilaterally.  Her heart is regular.  Her chest x-ray today shows no pneumothorax with a small left pleural effusion.  There is no definite air leak from the tubes so I clamped her chest tubes today and we will repeat her x-ray at 1:00.  If that is okay we will remove the tube and she can be discharged home.  Discharge instructions were given.  I will see her back in the hospital on Friday January 24 with a repeat chest x-ray.  Pathology is still pending as is her microbiology from her urine culture.

## 2018-11-25 NOTE — Care Management Note (Signed)
Case Management Note  Patient Details  Name: Alicia Whitney MRN: [id number] Date of Birth: Mar 24, 1986  Subjective/Objective:   Patient discharging today.  She currently lives at home with husband; patient states he will be unable to assist her at discharge and would no elborate on that.  She will be discharging with her mother to her mothers home at [facility] where she will stay until she has recovered.   PT recommended home health and walker.  Mother and patient are refusing home health.  Mother states she has a walker at her home; does not need another.  Denies difficulties obtaining medications.  She gets assistance through the cancer center for her medications.  She states her PCP is with the cancer center and does not need this RNCM to assist her with setting up a PCP.  Her mother will drive her back here for appointments.   No further needs identified at this time.                  Action/Plan:   Expected Discharge Date:  11/25/18               Expected Discharge Plan:  Home/Self Care  In-House Referral:     Discharge planning Services  CM Consult  Post Acute Care Choice:    Choice offered to:     DME Arranged:    DME Agency:     HH Arranged:  Patient Refused HH HH Agency:     Status of Service:  Completed, signed off  If discussed at [employer] of Stay Meetings, dates discussed:    Additional Comments:  Sherren Kerns, RN 11/25/2018, 2:43 PM

## 2018-11-25 NOTE — Discharge Summary (Signed)
[facility] SURGICAL DISCHARGE SUMMARY  Patient ID: Alicia Whitney MRN: [id number] DOB/AGE: Aug 23, 1986 33 y.o.  Admit date: 11/20/2018 Discharge date: 11/25/2018  Discharge Diagnoses Lung Mass Urinary Tract Infection  Consultants PCCM  Procedures -- 11/20/2018:  Preoperative bronchoscopy with left thoracotomy and wedge resection of left upper and left lower lobe lesions  HPI: Alicia Whitney is a 33 y.o. female with a history of left lung masses who presented to [facility] on 11/20/2018 for thoracotomy and left upper and lower lobe wedge resections with Dr Hulda Marin.   Hospital Course: Informed consent was obtained and documented, and patient underwent uneventful left thoracotomy and left upper and lower lobe wedge resections (Dr Hulda Marin, MD, 11/20/2018).  Post-operatively, remained in the ICU for POD1 while undergoing close monitoring and aggressive pulmonary toileting. Her chest XR were monitored daily. Her chest tubes were removed sequentially on POD4 (1/20) and POD5 (01/21). The patient's pain and breathing improved/resolved and advancement of patient's diet and ambulation were well-tolerated. The remainder of patient's hospital course was essentially unremarkable, and discharge planning was initiated accordingly with patient safely able to be discharged home with appropriate discharge instructions, antibiotics, pain control, and outpatient follow-up after all of her and her family's questions were answered to their expressed satisfaction.  Discharge Condition: Good    Allergies as of 11/25/2018      Reactions   Morphine And Related Nausea And Vomiting   Does not work for pt either   Onion Swelling, Other (See Comments)   Throat swelling   Latex Rash      Medication List    TAKE these medications   hydrOXYzine 25 MG tablet Commonly known as:  ATARAX/VISTARIL Take 1 tablet (25 mg total) by mouth every 8 (eight) hours as needed. What changed:  reasons to  take this   lamoTRIgine 25 MG tablet Commonly known as:  LAMICTAL Take 50 mg by mouth at bedtime.   methocarbamol 500 MG tablet Commonly known as:  ROBAXIN Take 1 tablet (500 mg total) by mouth 4 (four) times daily.   oxyCODONE-acetaminophen 7.5-325 MG tablet Commonly known as:  PERCOCET Take 1-2 tablets by mouth every 4 (four) hours as needed for moderate pain.   sertraline 50 MG tablet Commonly known as:  ZOLOFT Take 1 tablet (50 mg total) by mouth at bedtime.   sulfamethoxazole-trimethoprim 800-160 MG tablet Commonly known as:  BACTRIM DS,SEPTRA DS Take 1 tablet by mouth every 12 (twelve) hours for 3 days.   traMADol 50 MG tablet Commonly known as:  ULTRAM Take 2 tablets (100 mg total) by mouth every 6 (six) hours.   traZODone 150 MG tablet Commonly known as:  DESYREL Take 75 mg by mouth at bedtime.        Follow-up Information    Hulda Marin, MD. Schedule an appointment as soon as possible for a visit on 11/28/2018.   Specialties:  Cardiothoracic Surgery, General Surgery Why:  follow up s/p left thoracotomy and left upper and lower lobe resection....patient needs CXR prior to appointment Contact information: [address] [place] [address] [phone]            -- Lynden Oxford , PA-C [facility]  11/25/2018, 2:17 PM [phone] M-F: 7am - 4pm

## 2018-11-26 LAB — HIV ANTIBODY (ROUTINE TESTING W REFLEX): HIV Screen 4th Generation wRfx: NONREACTIVE

## 2018-11-27 ENCOUNTER — Other Ambulatory Visit: Payer: Self-pay

## 2018-11-27 DIAGNOSIS — R918 Other nonspecific abnormal finding of lung field: Secondary | ICD-10-CM

## 2018-11-27 LAB — QUANTIFERON-TB GOLD PLUS (RQFGPL)
QUANTIFERON TB1 AG VALUE: 0.04 [IU]/mL
QuantiFERON Mitogen Value: >10 IU/mL
QuantiFERON Nil Value: 0.04 IU/mL
QuantiFERON TB2 Ag Value: 0.04 IU/mL

## 2018-11-27 LAB — QUANTIFERON-TB GOLD PLUS: QuantiFERON-TB Gold Plus: NEGATIVE

## 2018-11-28 ENCOUNTER — Encounter: Payer: Self-pay | Admitting: Cardiothoracic Surgery

## 2018-11-28 ENCOUNTER — Other Ambulatory Visit: Payer: Self-pay

## 2018-11-28 ENCOUNTER — Ambulatory Visit (INDEPENDENT_AMBULATORY_CARE_PROVIDER_SITE_OTHER): Payer: Self-pay | Admitting: Cardiothoracic Surgery

## 2018-11-28 VITALS — BP 116/79 | HR 88 | Temp 97.5°F | Resp 16 | Ht 66.0 in | Wt 181.2 lb

## 2018-11-28 DIAGNOSIS — R918 Other nonspecific abnormal finding of lung field: Secondary | ICD-10-CM

## 2018-11-28 DIAGNOSIS — A150 Tuberculosis of lung: Secondary | ICD-10-CM

## 2018-11-28 MED ORDER — OXYCODONE-ACETAMINOPHEN 7.5-325 MG PO TABS: 1.0000 | ORAL_TABLET | ORAL | 0 refills | Status: DC | PRN

## 2018-11-28 MED ORDER — TRAMADOL HCL 50 MG PO TABS: 50.0000 mg | ORAL_TABLET | Freq: Four times a day (QID) | ORAL | 0 refills | Status: DC | PRN

## 2018-11-28 NOTE — Progress Notes (Signed)
She returns today in follow-up.  Overall she looks pretty well.  She states that her pain is under pretty good control.  She denied any fevers or chills.  On physical exam her thoracotomy wound is healing as expected.  There is no erythema or drainage.  Her lungs are equal bilaterally.  There is good air entry on both sides.  Her heart is regular.  She was unable to get a chest x-ray today and will bring her back on Monday for chest x-ray and to have her sutures removed.  I will see her at that time.  I did review with her and her mother the results of the pathology is best we have at this time.  Both the upper and lower lobe nodules were consistent with granulomatous disease and acid-fast bacilli.  I have made an appointment for her to meet with 1 of our pulmonologist to discuss these findings.  She did ask for refill on her tramadol and Percocet and we provided those to her.

## 2018-11-28 NOTE — Patient Instructions (Addendum)
Please follow up with Dr.Oaks on 12/01/2018.     Please be sure to have the Chest xray prior to seeing Dr.Oaks.   We have sent the referral to [facility] and someone from their office will call you within 5-7 days. If you do not hear from anyone within the time frame listed above please call and let us know.

## 2018-12-01 ENCOUNTER — Other Ambulatory Visit: Payer: Self-pay

## 2018-12-01 ENCOUNTER — Encounter: Payer: Self-pay | Admitting: Cardiothoracic Surgery

## 2018-12-01 ENCOUNTER — Ambulatory Visit (INDEPENDENT_AMBULATORY_CARE_PROVIDER_SITE_OTHER): Payer: Self-pay

## 2018-12-01 ENCOUNTER — Ambulatory Visit
Admission: RE | Admit: 2018-12-01 | Discharge: 2018-12-01 | Disposition: A | Discharge status: Home / Self Care | Payer: Self-pay | Source: Ambulatory Visit | Attending: Cardiothoracic Surgery | Admitting: Cardiothoracic Surgery

## 2018-12-01 VITALS — BP 111/79 | HR 93 | Temp 97.7°F | Resp 16 | Wt 177.8 lb

## 2018-12-01 DIAGNOSIS — R918 Other nonspecific abnormal finding of lung field: Secondary | ICD-10-CM | POA: Insufficient documentation

## 2018-12-01 DIAGNOSIS — A150 Tuberculosis of lung: Secondary | ICD-10-CM

## 2018-12-01 MED ORDER — METHOCARBAMOL 500 MG PO TABS: 500.0000 mg | ORAL_TABLET | Freq: Three times a day (TID) | ORAL | 0 refills | Status: DC

## 2018-12-01 NOTE — Patient Instructions (Addendum)
Follow up in 2 weeks with Dr Thelma Barge, you will need to have a chest x-ray prior.  Continue to use [employer], retail daily. We will refill your Robaxin and it will be for three times a day for the next week.  Your steri strips will start to follow off in about a week.

## 2018-12-01 NOTE — Progress Notes (Signed)
Patient ID: Alicia Whitney, female   DOB: 01/20/1986, 33 y.o.   MRN: [id number] Patient states that she feels like she is not getting a very full breath, she is able to take a full breath and no cough. She reports no fever or chills. She has been using her incentive spirometer daily and has gotten to 1200 with it. Per Dr Thelma Barge she needs to set up with a PCP and she says that she will be getting set up with the [facility].   Per Dr Thelma Barge we will refill her Robaxin 500 mg will take TID #21. She will follow up in 2 weeks with Dr Thelma Barge and have a CXR at that time prior to her visit.   Sutures and staples removed today. All areas look good with no signs of infection noted. Steri strips applied.

## 2018-12-03 ENCOUNTER — Encounter: Payer: Self-pay | Admitting: Cardiothoracic Surgery

## 2018-12-03 LAB — SURGICAL PATHOLOGY

## 2018-12-05 ENCOUNTER — Telehealth: Payer: Self-pay | Admitting: *Deleted

## 2018-12-05 NOTE — Telephone Encounter (Signed)
Patient was originally schedule for follow up with Dr. Orlie Dakin post surgery on 2/3. Patients office notes and pathology reviewed by Dr. Orlie Dakin. Patient does not need follow up at the cancer center at this time. Patient aware and appointment cancelled.

## 2018-12-08 ENCOUNTER — Ambulatory Visit: Payer: Self-pay | Admitting: Oncology

## 2018-12-10 ENCOUNTER — Ambulatory Visit
Admission: RE | Admit: 2018-12-10 | Discharge: 2018-12-10 | Disposition: A | Discharge status: Home / Self Care | Payer: Self-pay | Attending: Cardiothoracic Surgery | Admitting: Cardiothoracic Surgery

## 2018-12-10 ENCOUNTER — Ambulatory Visit (INDEPENDENT_AMBULATORY_CARE_PROVIDER_SITE_OTHER): Payer: Self-pay | Admitting: Cardiothoracic Surgery

## 2018-12-10 ENCOUNTER — Ambulatory Visit
Admission: RE | Admit: 2018-12-10 | Discharge: 2018-12-10 | Disposition: A | Discharge status: Home / Self Care | Payer: Self-pay | Source: Ambulatory Visit | Attending: Cardiothoracic Surgery | Admitting: Cardiothoracic Surgery

## 2018-12-10 ENCOUNTER — Encounter: Payer: Self-pay | Admitting: Cardiothoracic Surgery

## 2018-12-10 VITALS — BP 110/76 | HR 80 | Temp 97.5°F | Resp 15 | Ht 66.0 in | Wt 178.8 lb

## 2018-12-10 DIAGNOSIS — R918 Other nonspecific abnormal finding of lung field: Secondary | ICD-10-CM

## 2018-12-10 DIAGNOSIS — A150 Tuberculosis of lung: Secondary | ICD-10-CM

## 2018-12-10 NOTE — Patient Instructions (Signed)
Keep doing overhead arm stretches. Start gradually but do this everyday.  You may drive starting next week.  Keep your appointment with Pulmonary.  Follow up as needed.

## 2018-12-10 NOTE — Progress Notes (Signed)
She returns today in follow-up.  She states that she occasionally has some discomfort along her incision under her left breast.  She occasionally gets short of breath whenever she exercises.  Her lungs are clear bilaterally.  Her heart is regular.  Her thoracotomy wound is all healed as expected.  We did get a chest x-ray today and have independently reviewed that.  I see a small left-sided pleural effusion.  The patient does have an appointment with our pulmonary medicine colleagues towards the end of the month.  I encouraged her to continue her physical therapy and her incentive spirometer.  I did not make return appointment for her but would be happy to see her should the need arise.

## 2018-12-22 ENCOUNTER — Telehealth: Payer: Self-pay | Admitting: Cardiothoracic Surgery

## 2018-12-22 NOTE — Telephone Encounter (Signed)
Patient is calling and said she is in a lot of pain, said her pain level is at an eight and is asking if something could be called into the pharmacy for her. Please call patient and advise.

## 2018-12-23 LAB — FUNGAL ORGANISM REFLEX

## 2018-12-23 LAB — FUNGUS CULTURE WITH STAIN

## 2018-12-23 LAB — FUNGUS CULTURE RESULT

## 2018-12-23 NOTE — Telephone Encounter (Signed)
Patient reports pain on her left side under the breast and at the incision site for the past 2 days. Has used Ibuprofen and Tramadol and also made use of a heating pad. She reports no know unusual activity prior to this. She has had no improvement in symptoms.  Spoke with Dr Thelma Barge and relayed this information and he said to have the patient report to the ER for pain management.  Patient notified of providers instructions.

## 2018-12-23 NOTE — Telephone Encounter (Signed)
Left message for patient to return call to office. 

## 2018-12-23 NOTE — Telephone Encounter (Signed)
Error

## 2018-12-25 ENCOUNTER — Institutional Professional Consult (permissible substitution): Payer: Self-pay | Admitting: Pulmonary Disease

## 2018-12-31 ENCOUNTER — Encounter: Payer: Self-pay | Admitting: Pulmonary Disease

## 2019-01-07 LAB — ACID FAST CULTURE WITH REFLEXED SENSITIVITIES (MYCOBACTERIA): Acid Fast Culture: NEGATIVE

## 2019-01-15 ENCOUNTER — Inpatient Hospital Stay
Admit: 2019-01-15 | Discharge: 2019-01-16 | Disposition: A | Discharge status: Home / Self Care | Payer: Self-pay | Attending: Emergency Medicine

## 2019-01-15 ENCOUNTER — Emergency Department: Admit: 2019-01-16 | Payer: Self-pay

## 2019-01-15 ENCOUNTER — Emergency Department: Admit: 2019-01-15 | Payer: Self-pay

## 2019-01-15 DIAGNOSIS — R0781 Pleurodynia: Secondary | ICD-10-CM

## 2019-01-15 LAB — URINALYSIS W/ RFLX MICROSCOPIC
Bilirubin, Urine: NEGATIVE
Bilirubin: NEGATIVE
Blood, Urine: NEGATIVE
Blood: NEGATIVE
Glucose, Ur: NEGATIVE mg/dl
Glucose: NEGATIVE mg/dl
Ketone: NEGATIVE mg/dl
Ketones, Urine: NEGATIVE mg/dl
Nitrite, Urine: NEGATIVE
Nitrites: NEGATIVE
Protein, UA: NEGATIVE mg/dl
Protein: NEGATIVE mg/dl
Specific Gravity, UA: 1.01 (ref 1.005–1.030)
Specific gravity: 1.01 (ref 1.005–1.030)
Urobilinogen, UA, POCT: 0.2 mg/dl (ref 0.0–1.0)
Urobilinogen: 0.2 mg/dl (ref 0.0–1.0)
pH (UA): 7.5 (ref 5.0–9.0)
pH, UA: 7.5 (ref 5.0–9.0)

## 2019-01-15 LAB — POC URINE MICROSCOPIC
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
## 2019-01-15 NOTE — ED Notes (Signed)
Went over discharge instructions with patient.  Patient given opportunity to ask questions.  Patient verbalized understanding with all instructions.
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
## 2019-01-16 LAB — BASIC METABOLIC PANEL
Anion Gap: 4 mmol/L — ABNORMAL LOW (ref 5–15)
BUN: 11 mg/dl (ref 7–25)
CO2: 29 mEq/L (ref 21–32)
Calcium: 9.3 mg/dl (ref 8.5–10.1)
Chloride: 108 mEq/L — ABNORMAL HIGH (ref 98–107)
Creatinine: 0.9 mg/dl (ref 0.6–1.3)
EGFR IF NonAfrican American: >60
GFR African American: >60
Glucose: 89 mg/dl (ref 74–106)
Potassium: 3.8 mEq/L (ref 3.5–5.1)
Sodium: 140 mEq/L (ref 136–145)

## 2019-01-16 LAB — METABOLIC PANEL, BASIC
Anion gap: 4 mmol/L — ABNORMAL LOW (ref 5–15)
BUN: 11 mg/dl (ref 7–25)
CO2: 29 mEq/L (ref 21–32)
Calcium: 9.3 mg/dl (ref 8.5–10.1)
Chloride: 108 mEq/L — ABNORMAL HIGH (ref 98–107)
Creatinine: 0.9 mg/dl (ref 0.6–1.3)
GFR est AA: >60
GFR est non-AA: >60
Glucose: 89 mg/dl (ref 74–106)
Potassium: 3.8 mEq/L (ref 3.5–5.1)
Sodium: 140 mEq/L (ref 136–145)

## 2019-01-16 MED ORDER — IOPAMIDOL 61 % IV SOLN
30061 mg iodine /mL (61 %) | Freq: Once | INTRAVENOUS | Status: AC
Start: 2019-01-16 — End: 2019-01-15
  Administered 2019-01-16: 02:00:00 via INTRAVENOUS

## 2019-01-16 MED ORDER — ONDANSETRON (PF) 4 MG/2 ML INJECTION
4 mg/2 mL | Freq: Once | INTRAMUSCULAR | Status: AC
Start: 2019-01-16 — End: 2019-01-15
  Administered 2019-01-16: via INTRAVENOUS

## 2019-01-16 MED ORDER — SODIUM CHLORIDE 0.9% BOLUS IV
0.9 % | INTRAVENOUS | Status: AC
Start: 2019-01-16 — End: 2019-01-15
  Administered 2019-01-16: via INTRAVENOUS

## 2019-01-16 MED ORDER — NAPROXEN 500 MG TAB
500 mg | ORAL_TABLET | Freq: Two times a day (BID) | ORAL | 0 refills | Status: AC
Start: 2019-01-16 — End: 2019-01-25

## 2019-01-16 MED ORDER — MORPHINE 4 MG/ML SYRINGE
4 mg/mL | INTRAMUSCULAR | Status: AC
Start: 2019-01-16 — End: 2019-01-15
  Administered 2019-01-16: via INTRAVENOUS

## 2019-01-16 MED ORDER — METHOCARBAMOL 750 MG TAB
750 mg | ORAL_TABLET | Freq: Four times a day (QID) | ORAL | 0 refills | Status: AC
Start: 2019-01-16 — End: 2019-01-22

## 2019-01-16 MED FILL — ISOVUE-300  61 % INTRAVENOUS SOLUTION: 300 mg iodine /mL (61 %) | INTRAVENOUS | Qty: 85

## 2019-01-16 MED FILL — MORPHINE 4 MG/ML SYRINGE: 4 mg/mL | INTRAMUSCULAR | Qty: 1

## 2019-01-16 MED FILL — ONDANSETRON (PF) 4 MG/2 ML INJECTION: 4 mg/2 mL | INTRAMUSCULAR | Qty: 2

## 2019-01-26 ENCOUNTER — Telehealth: Payer: Self-pay

## 2019-01-26 NOTE — Telephone Encounter (Signed)
LM for patient to call our office to confirm apt. Also to give directions to our office and ask covid questions.

## 2019-01-26 NOTE — Telephone Encounter (Signed)
Spoke to patient regarding Thursday apt. Screened for COVID-19.  Have you recently traveled any where out of the local area in the last 2 weeks? no  Have you been in close contact with a person diagnosed with COVID-19 within the last 2 weeks? no  Do you currently have any fever, cough, or shortness of breath? No, just from the surgery  Okay to proceed with visit.

## 2019-01-29 ENCOUNTER — Institutional Professional Consult (permissible substitution): Payer: Self-pay | Admitting: Pulmonary Disease

## 2019-02-19 ENCOUNTER — Institutional Professional Consult (permissible substitution): Payer: Self-pay | Admitting: Pulmonary Disease

## 2019-03-05 ENCOUNTER — Telehealth: Payer: Self-pay | Admitting: Pulmonary Disease

## 2019-03-05 NOTE — Telephone Encounter (Signed)
Called pt to see if she wanted to be seen sooner.

## 2019-03-18 ENCOUNTER — Other Ambulatory Visit: Payer: Self-pay

## 2019-03-18 ENCOUNTER — Encounter: Payer: Self-pay | Admitting: Pulmonary Disease

## 2019-03-18 ENCOUNTER — Ambulatory Visit (INDEPENDENT_AMBULATORY_CARE_PROVIDER_SITE_OTHER): Payer: Self-pay | Admitting: Pulmonary Disease

## 2019-03-18 VITALS — BP 120/70 | HR 77 | Ht 66.0 in | Wt 183.0 lb

## 2019-03-18 DIAGNOSIS — R0600 Dyspnea, unspecified: Secondary | ICD-10-CM

## 2019-03-18 DIAGNOSIS — G8918 Other acute postprocedural pain: Secondary | ICD-10-CM

## 2019-03-18 DIAGNOSIS — A319 Mycobacterial infection, unspecified: Secondary | ICD-10-CM

## 2019-03-18 DIAGNOSIS — R918 Other nonspecific abnormal finding of lung field: Secondary | ICD-10-CM

## 2019-03-18 MED ORDER — GABAPENTIN 100 MG PO CAPS: ORAL_CAPSULE | ORAL | 1 refills | Status: DC

## 2019-03-18 NOTE — Patient Instructions (Addendum)
1. Neurontin one capsule at bedtime for 3 days then you can increase to 1 capsule twice a day.  I am starting you on a lower dose because of your use of Lamictal and Zoloft.  We discussed using lidocaine patches over-the-counter to apply to the area where you feel the most discomfort in your chest.  2.  You do not have TB.  The organism that was identified is different bacteria that was taken out when you had the surgery.  3.  We will see you in follow-up in 3 to 4 weeks time.  Please call us sooner should you have any new issues.

## 2019-03-18 NOTE — Progress Notes (Signed)
Subjective:    Patient ID: Alicia MuleRebecca Whitney, female    DOB: 11/10/1985, 33 y.o.   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  Patient states that she presents to "find out if I have TB".  She is kindly referred by Dr. Hulda Marinimothy Oaks.  Her saga started around 15 October 2019 when she presented to the emergency room for shortness of breath and chest pain.  No distinct etiology of her symptoms could be found at the time.  She had a CT scan of the chest at that time that showed a left lower lobe pulmonary nodule and a lingular nodule.  The larger nodule was noted in the posterior aspect of the left lower lobe.  At the time the reading by radiology noted that malignancy could not be excluded.  For unclear reasons, she was referred to oncology.  She was evaluated by oncology and a PET/CT was obtained and showed that the lesions were not particularly hypermetabolic.  Review of old films showed that the lingular lesion had been present 2 years prior but appears to be somewhat larger.  Because of this she was referred to thoracic surgery. She underwent thoracotomy and wedge resection of the left upper and left lower lobe lesions on 20 November 2018 by Dr. Hulda Marinimothy Oaks.  Since then she has had issues with debilitating chest pain and increasing shortness of breath.  Pathology of the lesions showed noncaseating granulomas with rare AFB organisms.  QuantiFERON gold was negative.  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  The patient has not had increasing cough but has had persistent cough that has been dry and nonproductive.  She has had shortness of breath mostly on exertion.  No hemoptysis.  As noted she complains of pain particularly mostly where her incisions where from her thoracic surgery.  She finds this pain debilitating at times.  She has not had any fevers, chills or sweats.  No orthopnea or paroxysmal  nocturnal dyspnea.  She continues to have issues with anxiety.  She states she has not been able to return to work due to inability to lift things from the chest pain.  In addition her work has been closed due to [employer] for COVID-19.  Patient's past medical history to include psychiatric history, family history and social history has been reviewed.  She briefly smoked tobacco for a year but quit in 2012.  She only smoked a quarter of a pack of cigarettes per day at that time.  She has had issues with substance abuse in the past but currently denies this.  She has issues with bipolar disorder and PTSD for which she is on occasions as directed by [facility].    Review of Systems  Constitutional: Positive for fatigue.  Eyes: Negative.   Respiratory: Positive for cough (Dry) and shortness of breath (On exertion). Negative for wheezing.        Chest discomfort related to recent thoracotomy.  Cardiovascular: Negative for palpitations and leg swelling.  Gastrointestinal: Negative.        + heartburn,indigestion  Endocrine: Negative.   Genitourinary: Negative.   Musculoskeletal: Positive for arthralgias and joint swelling.       Chest wall pain since the thoracotomy.   Skin: Negative.   Allergic/Immunologic: Negative.   Neurological: Negative.   Hematological: Negative.   Psychiatric/Behavioral: The patient is nervous/anxious.   All other systems reviewed and are negative.  Objective:   Physical Exam Vitals signs and nursing note reviewed.  Constitutional:      Appearance: Normal appearance. She is overweight. She is not ill-appearing or toxic-appearing.  HENT:     Head: Normocephalic and atraumatic.     Comments: Nose/mouth not examined due to mass requirement due to COVID-19.    Right Ear: External ear normal.  Eyes:     General: No scleral icterus.    Conjunctiva/sclera: Conjunctivae normal.     Pupils: Pupils are equal, round, and reactive to light.  Neck:     Musculoskeletal:  Neck supple.     Vascular: No JVD.     Trachea: Trachea and phonation normal.  Cardiovascular:     Rate and Rhythm: Normal rate and regular rhythm.     Pulses: Normal pulses.     Heart sounds: Normal heart sounds, S1 normal and S2 normal. No murmur.  Pulmonary:     Effort: Pulmonary effort is normal.     Breath sounds: Normal breath sounds.  Abdominal:     General: There is no distension.     Palpations: Abdomen is soft.  Musculoskeletal: Normal range of motion.     Right lower leg: No edema.     Left lower leg: No edema.  Neurological:     General: No focal deficit present.     Mental Status: She is alert and oriented to person, place, and time.  Psychiatric:        Mood and Affect: Mood is anxious and depressed.        Speech: Speech normal.        Behavior: Behavior is withdrawn.     I have reviewed the available laboratory data and imaging.  The patient had negative HIV testing in January 2020 as well as QuantiFERON gold was negative at that time as well.  I have reviewed the pathology reports as well as the microbiology reports from her surgical specimens obtained in January.  Her spirometry performed prior to surgery was benign.      Assessment & Plan:   1. 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 Surgical resection is the best treatment for "solitary" MAI nodules. Multiple antituberculous drug therapy is indicated for patients with chronic infection that impairs function or causes hemoptysis.  The patient has not had evidence of a more chronic infection nor has she had hemoptysis.  Her other symptoms are not related to these lung nodules.  As these were removed no other therapy is indicated at present.  The lung nodules and questioned were not cultured.  She did have pleural fluid cultured and this showed no evidence of MAI.  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  Will recommend follow-up CT in 3 months time.  Otherwise no further therapy is indicated.  2.  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  3.  Dyspnea: Her lung function is normal.  I am not certain what is the etiology of her dyspnea is.  Overall she feels that this may be getting somewhat better currently this symptom is exacerbated by her symptom of pain.  We will continue to delve into this issue.  She has applied for Medicaid and would like to back on studies at present.  4.  Postoperative chest wall pain:  We will give her a trial of low-dose Neurontin.  She was given instructions on the use of the medication as well as potential side effects to monitor for.  She understood the instructions.  We discussed establishing care with a primary care physician.  She has been referred to the community clinic.  She will be seen in follow-up in 3 to 4 weeks time.  Fully by that time she will have her Medicaid establish and we will be able to delve into her symptom of dyspnea more in-depth.  She has been advised to call sooner should her symptoms worsen.  This chart was dictated using voice recognition software/Dragon.  Despite best efforts to proofread, errors can occur which can change the meaning.  Any change was purely unintentional.

## 2019-03-19 DIAGNOSIS — A319 Mycobacterial infection, unspecified: Secondary | ICD-10-CM | POA: Insufficient documentation

## 2019-03-19 DIAGNOSIS — R0600 Dyspnea, unspecified: Secondary | ICD-10-CM | POA: Insufficient documentation

## 2019-04-08 ENCOUNTER — Ambulatory Visit (INDEPENDENT_AMBULATORY_CARE_PROVIDER_SITE_OTHER): Payer: Self-pay | Admitting: Pulmonary Disease

## 2019-04-08 ENCOUNTER — Other Ambulatory Visit: Payer: Self-pay

## 2019-04-08 ENCOUNTER — Encounter: Payer: Self-pay | Admitting: Pulmonary Disease

## 2019-04-08 VITALS — BP 126/74 | HR 90 | Temp 97.8°F | Ht 66.0 in | Wt 181.0 lb

## 2019-04-08 DIAGNOSIS — R0600 Dyspnea, unspecified: Secondary | ICD-10-CM

## 2019-04-08 DIAGNOSIS — R918 Other nonspecific abnormal finding of lung field: Secondary | ICD-10-CM

## 2019-04-08 DIAGNOSIS — A319 Mycobacterial infection, unspecified: Secondary | ICD-10-CM

## 2019-04-08 DIAGNOSIS — G8918 Other acute postprocedural pain: Secondary | ICD-10-CM

## 2019-04-08 MED ORDER — METHOCARBAMOL 750 MG PO TABS: 750.0000 mg | ORAL_TABLET | Freq: Four times a day (QID) | ORAL | 0 refills | Status: DC | PRN

## 2019-04-08 NOTE — Patient Instructions (Signed)
1.  We will refer you to the pain clinic for management of your pain.  2.  A prescription for the Robaxin was sent to your pharmacy.   3.  We will see you in follow-up in  3 months time.

## 2019-04-08 NOTE — Progress Notes (Signed)
Subjective:    Patient ID: Juliann MuleRebecca Rumberger, female    DOB: 06/07/1986, 33 y.o.   MRN: [id number]  HPI Patient is a 33 year old remote former smoker who presents for a follow-up of her 18 Mar 2019 visit.  Recall that the patient underwent thoracotomy due to lung masses that were noted on CT scan of the chest.  The patient was noted to have a left lower lobe and a lingular nodule.  She underwent thoracotomy on January 2020 and since then has had debilitating chest pain.  The pathology on the lung lesions was noted to be noncaseating granulomas patient has QuantiFERON gold negative no MTB has been grown out of the lesions. 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  Aside from the chest pain and some fatigue on exertion the patient voices no new complaint today.  She has not had any fevers, chills or sweats.  Because of her chest pain she has not been able to return to work.  She had a trial of gabapentin provided during her last visit however this will need to be carefully monitored as she is on other medications that may enhance the effects of gabapentin.  Patient has not had any fevers, chills or sweats.  No cough, no sputum production no hemoptysis.  She appears preoccupied with her chest pain which seems to be quite debilitating to her.   Review of Systems  Constitutional: Positive for fatigue.  Eyes: Negative.   Respiratory: Positive for shortness of breath (On exertion).        Chest discomfort related to recent thoracotomy.  Cardiovascular: Negative for palpitations and leg swelling.  Gastrointestinal: Negative.        + heartburn,indigestion  Endocrine: Negative.   Genitourinary: Negative.   Musculoskeletal: Negative for arthralgias and joint swelling.       Chest wall pain since the thoracotomy.   Skin: Negative.   Allergic/Immunologic: Negative.   Neurological: Negative.   Hematological: Negative.    Psychiatric/Behavioral: The patient is nervous/anxious.   All other systems reviewed and are negative.      Objective:   Physical Exam Vitals signs and nursing note reviewed.  Constitutional:      Appearance: Normal appearance. She is overweight. She is not ill-appearing or toxic-appearing.  HENT:     Head: Normocephalic and atraumatic.     Comments: Nose/mouth not examined due to mass requirement due to COVID-19.    Right Ear: External ear normal.  Eyes:     General: No scleral icterus.    Conjunctiva/sclera: Conjunctivae normal.     Pupils: Pupils are equal, round, and reactive to light.  Neck:     Musculoskeletal: Neck supple.     Vascular: No JVD.     Trachea: Trachea and phonation normal.  Cardiovascular:     Rate and Rhythm: Normal rate and regular rhythm.     Pulses: Normal pulses.     Heart sounds: Normal heart sounds, S1 normal and S2 normal. No murmur.  Pulmonary:     Effort: Pulmonary effort is normal.     Breath sounds: Normal breath sounds.  Abdominal:     General: There is no distension.     Palpations: Abdomen is soft.  Musculoskeletal: Normal range of motion.     Right lower leg: No edema.     Left lower leg: No edema.  Neurological:     General: No focal deficit present.     Mental  Status: She is alert and oriented to person, place, and time.  Psychiatric:        Mood and Affect: Mood is anxious and depressed.        Speech: Speech normal.        Behavior: Behavior is withdrawn.    No significant change from her prior exam.       Assessment & Plan:   1. 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 Surgical resection is the best treatment for "solitary" MAI nodules.  The patient has not had evidence of a more chronic infection nor has she had hemoptysis.  Her other symptoms are not related to these lung nodules and therefore, further  therapy is not necessary.  Dissection is also curative. The lung nodules in question  were not cultured.  She did have pleural fluid cultured and this showed no evidence of MAI.  Given that her QuantiFERON gold was negative I suspect her infection was indeed MAI.  Will recommend follow-up CT in 2 months time.  Otherwise no further therapy is indicated.  2.  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  3.  Dyspnea: This symptom is stable.  I cannot ascribe it to pulmonary issue.  May be related to splinting from chest wall pain and aggravated by anxiety.  We will continue to follow this expectantly.  4.  Postoperative chest wall pain:  I would continue Neurontin for now.  She does feel that Robaxin helps her pain.  She received a refill for the Robaxin as a one-time order.  She will be referred to the pain clinic.  Further medications for her pain will be as directed by the pain clinic.  She may require nerve block injection.  We will see the patient in follow-up in 3 months time she is to contact us prior to that time should any new difficulties arise.  This chart was dictated using voice recognition software/Dragon.  Despite best efforts to proofread, errors can occur which can change the meaning.  Any change was purely unintentional.

## 2019-04-14 ENCOUNTER — Telehealth: Payer: Self-pay | Admitting: Pulmonary Disease

## 2019-04-14 NOTE — Telephone Encounter (Signed)
Received call from pt, who is requesting update on pain management referral.  Referral was placed to Dr. Holley Raring on 04/08/2019.  I have provided pt with Dr. Elwyn Lade contact number.  Nothing further is needed at this time.

## 2019-04-17 ENCOUNTER — Other Ambulatory Visit: Payer: Self-pay | Admitting: Pulmonary Disease

## 2019-04-17 ENCOUNTER — Institutional Professional Consult (permissible substitution): Payer: Self-pay | Admitting: Pulmonary Disease

## 2019-04-17 MED ORDER — GABAPENTIN 100 MG PO CAPS: ORAL_CAPSULE | ORAL | 1 refills | Status: DC

## 2019-05-06 NOTE — Progress Notes (Signed)
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 ROM: Unrestricted ROM                  Muscle Tone/Strength: Functionally intact. No obvious neuro-muscular anomalies detected.  Muscle Tone/Strength: Functionally intact. No obvious neuro-muscular anomalies detected.  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 Diagnoses of Neuropathic pain, Severe recurrent major depression without psychotic features (HCC), PTSD (post-traumatic stress disorder), Cocaine abuse (HCC), Opioid abuse (HCC), Bipolar affective disorder,  remission status unspecified (HCC), and Chronic pain syndrome were also pertinent to this visit.  Visit Diagnosis (New problems to examiner): 1. Post-thoracotomy pain syndrome   2. Neuropathic pain   3. Severe recurrent major depression without psychotic features (HCC)   4. PTSD (post-traumatic stress disorder)   5. Cocaine abuse (HCC)   6. Opioid abuse (HCC)   7. Bipolar affective disorder, remission status unspecified (HCC)   8. Chronic pain syndrome    I had extensive discussion with the patient about the goals of pain management.  We discussed nonpharmacological approaches to pain management that include physical therapy, dieting, sleep hygiene, psychotherapy, interventional therapy.  We discussed the importance of understanding the type of pain including neuropathic, nociceptive, centralized.  I also stressed the importance of multimodal analgesia with an emphasis on nondrug modalities including self management, behavioral health support and physical therapy.  We discussed the importance of physical therapy and how a individualized physical therapy and occupational therapy program tailored to patient limitations can be helpful at improving physical function. We also discussed the importance of insomnia and disrupted sleep and how improved sleep hygiene and cognitive therapy could be helpful.  Psychotherapy including CBT, mind-body therapies, pain coping strategies can be helpful for patients whose pain impacts mood, sleep, quality of life, relationships with others.  We discussed avoiding benzodiazepines.  I also had an extensive discussion with the patient about interventional therapies which is my expertise and how these could be incorporated into an effective multimodal  pain management plan.  Patient is high risk for opioid prescribing. The patient will not be a candidate for opioid therapy through this clinic. Patient will be optimized on non-opioid adjunctive analgesics and a comprehensive  pain management plan was developed.  Will focus on non opioid based strategies. Start with Gabapentin, 300 mg twice daily, goal be to get to 300 mg 3 times daily.  Alternatives could include Lyrica and/or Cymbalta.  Patient does find benefit with Robaxin and I have instructed her to continue that medication.  Future interventional considerations could include left intercostal nerve block and possible pulsed RFA.  Pharmacotherapy (current): Medications ordered:  Meds ordered this encounter  Medications  . gabapentin (NEURONTIN) 300 MG capsule    Sig: 300 mg twice daily for 4 weeks and then increase to 300 mg three times a day if no side effects    Dispense:  90 capsule    Refill:  1    Do not place this medication, or any other prescription from our practice, on "Automatic Refill". Patient may have prescription filled one day early if pharmacy is closed on scheduled refill date.  . trolamine salicylate (ASPERCREME/ALOE) 10 % cream    Sig: Apply 1 application topically as needed for muscle pain.    Dispense:  85 g    Refill:  0   Medications administered during this visit: Alicia Whitney had no medications administered during this visit.   Pharmacological management options:  Opioid Analgesics: Not applicable, not a treatment option for reasons detailed above  Membrane stabilizer: Gabapentin, consider Lyrica or Cymbalta in future  Muscle relaxant: Adequate regimen  NSAID: To be determined at a later time  Other analgesic(s): To be determined at a later time   Interventional management options: Ms. Alicia Whitney was informed that there is no guarantee that she would be a candidate for interventional therapies. The decision will be based on the results of diagnostic studies, as well as Ms. Bowne's risk profile.  Procedure(s) under consideration:  Left intercostal nerve block and possible pulsed RFA   Provider-requested follow-up: Return in about 6 weeks (around 06/18/2019) for Medication  Management, in person.  Of note, Marijean NiemannKori Rice, Pain Medicine nurse was present as chaperone during encounter.   Future Appointments  Date Time Provider Department Center  06/18/2019 10:45 AM Edward JollyLateef, Curtina Grills, MD [facility]    Primary Care Physician: Patient, No Pcp [facility] Note by: Edward JollyBilal Marajade Lei, MD Date: 05/07/2019; Time: 2:09 PM  Note: This dictation was prepared with Dragon dictation. Any transcriptional errors that may result from this process are unintentional.

## 2019-05-07 ENCOUNTER — Encounter: Payer: Self-pay | Admitting: Student in an Organized Health Care Education/Training Program

## 2019-05-07 ENCOUNTER — Ambulatory Visit
Payer: Self-pay | Attending: Student in an Organized Health Care Education/Training Program | Admitting: Student in an Organized Health Care Education/Training Program

## 2019-05-07 ENCOUNTER — Other Ambulatory Visit: Payer: Self-pay

## 2019-05-07 VITALS — BP 110/78 | HR 89 | Temp 99.2°F | Resp 16 | Ht 66.0 in | Wt 179.0 lb

## 2019-05-07 DIAGNOSIS — F319 Bipolar disorder, unspecified: Secondary | ICD-10-CM

## 2019-05-07 DIAGNOSIS — F1410 Cocaine abuse, uncomplicated: Secondary | ICD-10-CM

## 2019-05-07 DIAGNOSIS — M792 Neuralgia and neuritis, unspecified: Secondary | ICD-10-CM

## 2019-05-07 DIAGNOSIS — F4310 Post-traumatic stress disorder, unspecified: Secondary | ICD-10-CM

## 2019-05-07 DIAGNOSIS — F1110 Opioid abuse, uncomplicated: Secondary | ICD-10-CM

## 2019-05-07 DIAGNOSIS — G8912 Acute post-thoracotomy pain: Secondary | ICD-10-CM

## 2019-05-07 DIAGNOSIS — F332 Major depressive disorder, recurrent severe without psychotic features: Secondary | ICD-10-CM

## 2019-05-07 DIAGNOSIS — G894 Chronic pain syndrome: Secondary | ICD-10-CM

## 2019-05-07 MED ORDER — GABAPENTIN 300 MG PO CAPS: ORAL_CAPSULE | ORAL | 1 refills | Status: DC

## 2019-05-07 MED ORDER — TROLAMINE SALICYLATE 10 % EX CREA: 1.0000 "application " | TOPICAL_CREAM | CUTANEOUS | 0 refills | Status: DC | PRN

## 2019-05-07 NOTE — Patient Instructions (Signed)
Gabapentin and aspercreme has been escribed to your pharmacy.

## 2019-05-07 NOTE — Progress Notes (Signed)
Safety precautions to be maintained throughout the outpatient stay will include: orient to surroundings, keep bed in low position, maintain call bell within reach at all times, provide assistance with transfer out of bed and ambulation.  

## 2019-05-29 ENCOUNTER — Encounter: Payer: Self-pay | Admitting: Pulmonary Disease

## 2019-06-18 ENCOUNTER — Encounter: Payer: Self-pay | Admitting: Student in an Organized Health Care Education/Training Program

## 2019-09-18 ENCOUNTER — Telehealth: Payer: Self-pay

## 2019-09-18 NOTE — Telephone Encounter (Signed)
Message left for the patient. We have filled out her forms for medical consultation for Plasma donation. Need to know if she also needs the pathology report.

## 2019-11-10 IMAGING — DX DG CHEST 1V PORT
1 series · 1 of 1 positions shown · non-contrast
Comparison: 11/20/2018

CLINICAL DATA: Chest tube

EXAM:
PORTABLE CHEST 1 VIEW

[chest ap]
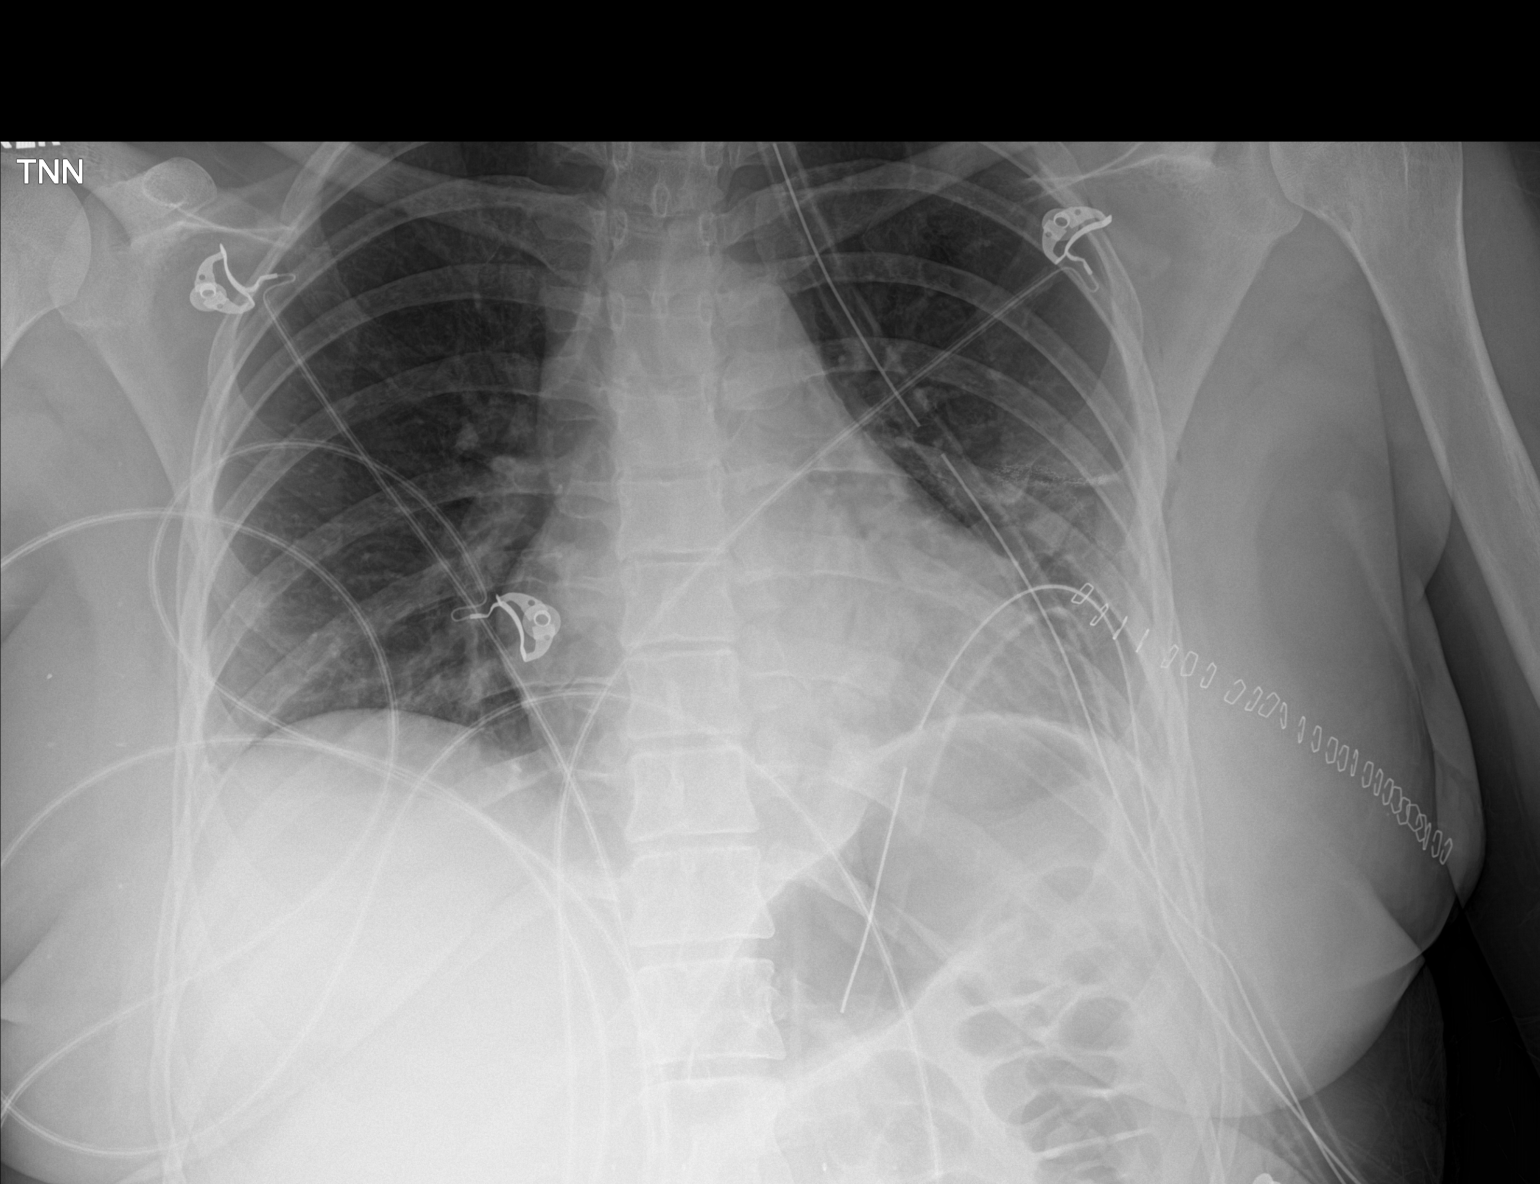

[1 of 1 positions shown; findings below may reference images not displayed]

FINDINGS: Left chest tubes are in place, unchanged. No pneumothorax.
Postoperative changes on the left. Low lung volumes. Left basilar
atelectasis. No confluent opacity on the right. No visible
effusions.
IMPRESSION: Postoperative changes on the left with stable position of left chest
tubes. No visible pneumothorax.

## 2019-11-29 IMAGING — CR DG CHEST 2V
1 series · 2 of 2 positions shown · non-contrast
Comparison: 12/01/2018

CLINICAL DATA: Post LEFT lung lobectomy on 11/20/2018, former
smoker

EXAM:
CHEST - 2 VIEW

[Series 1: dg chest 2 view · 0.14mm/px · 2 of 2 slices shown]
[im 1/2]
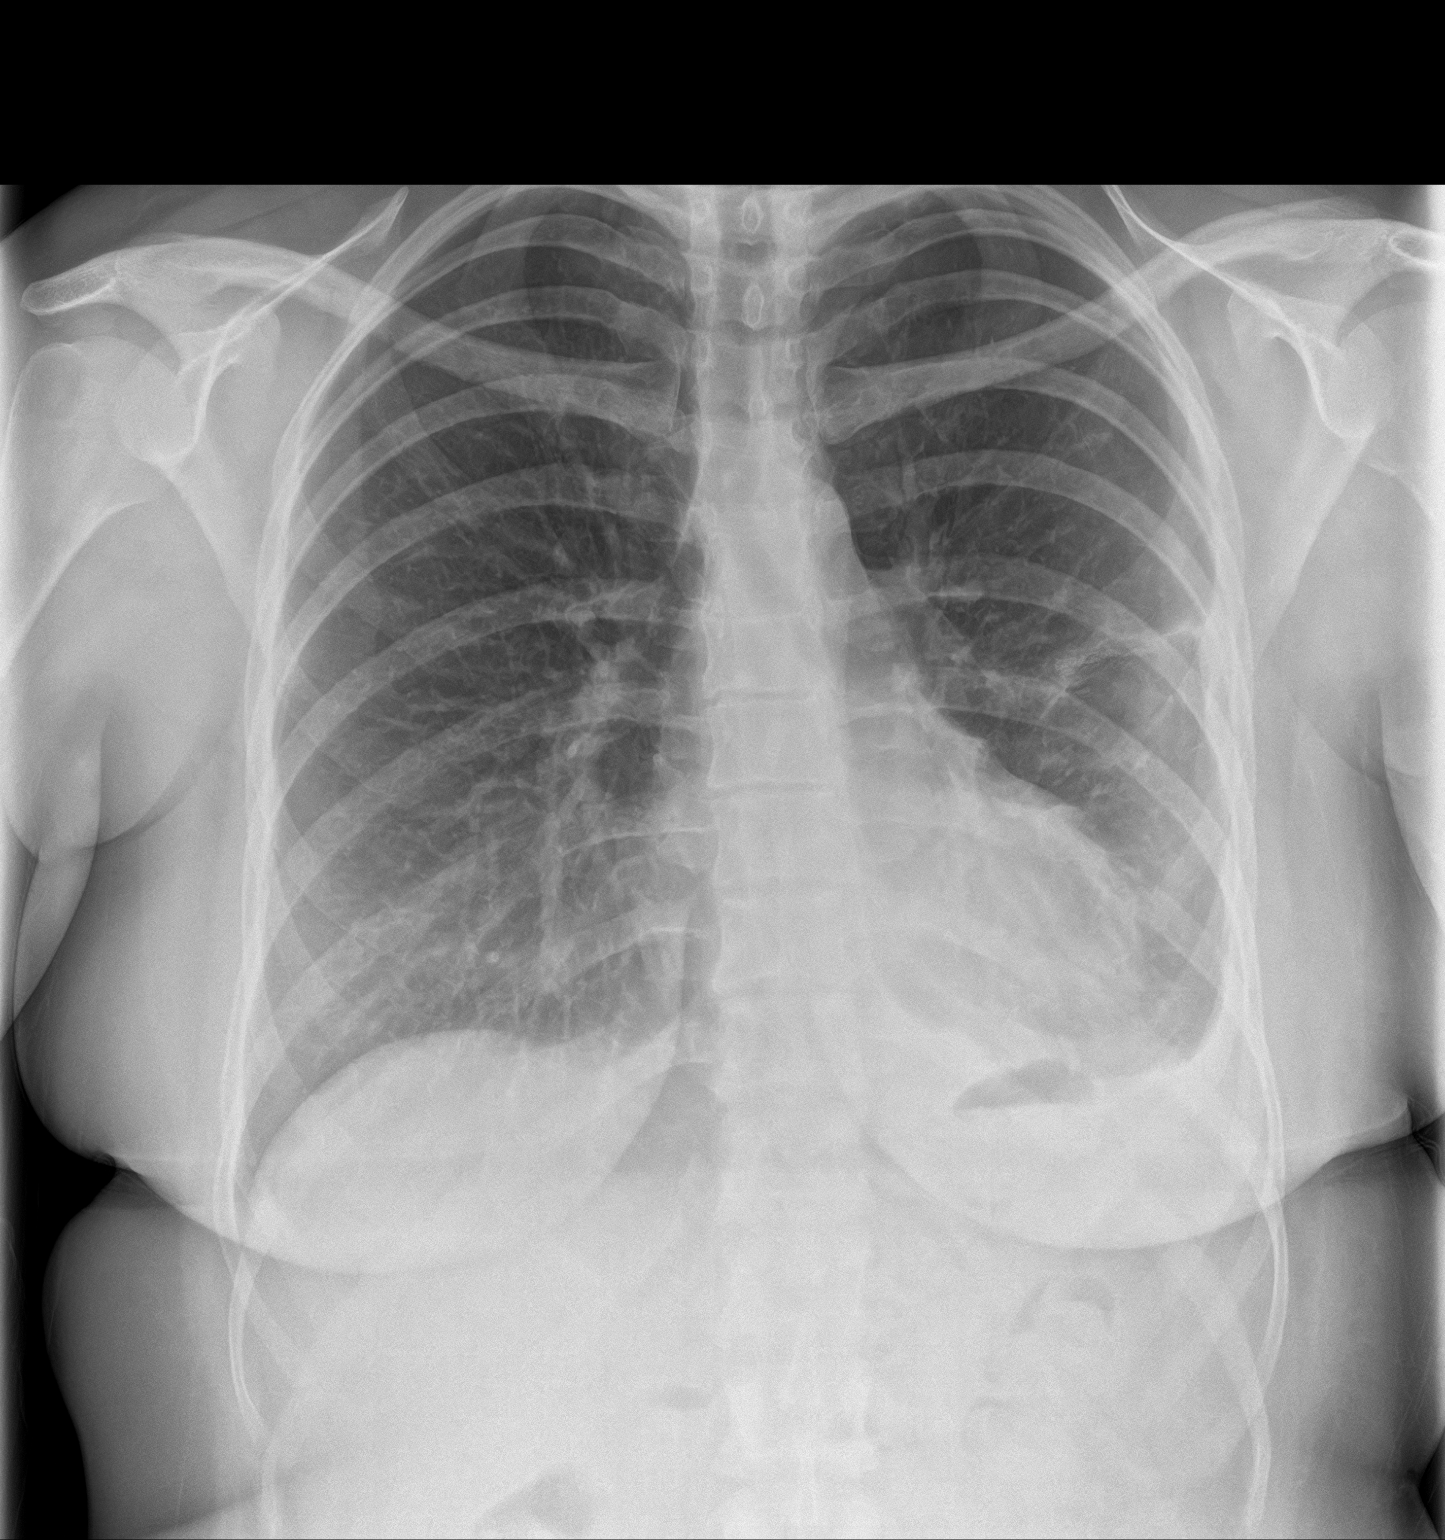
[im 2/2]
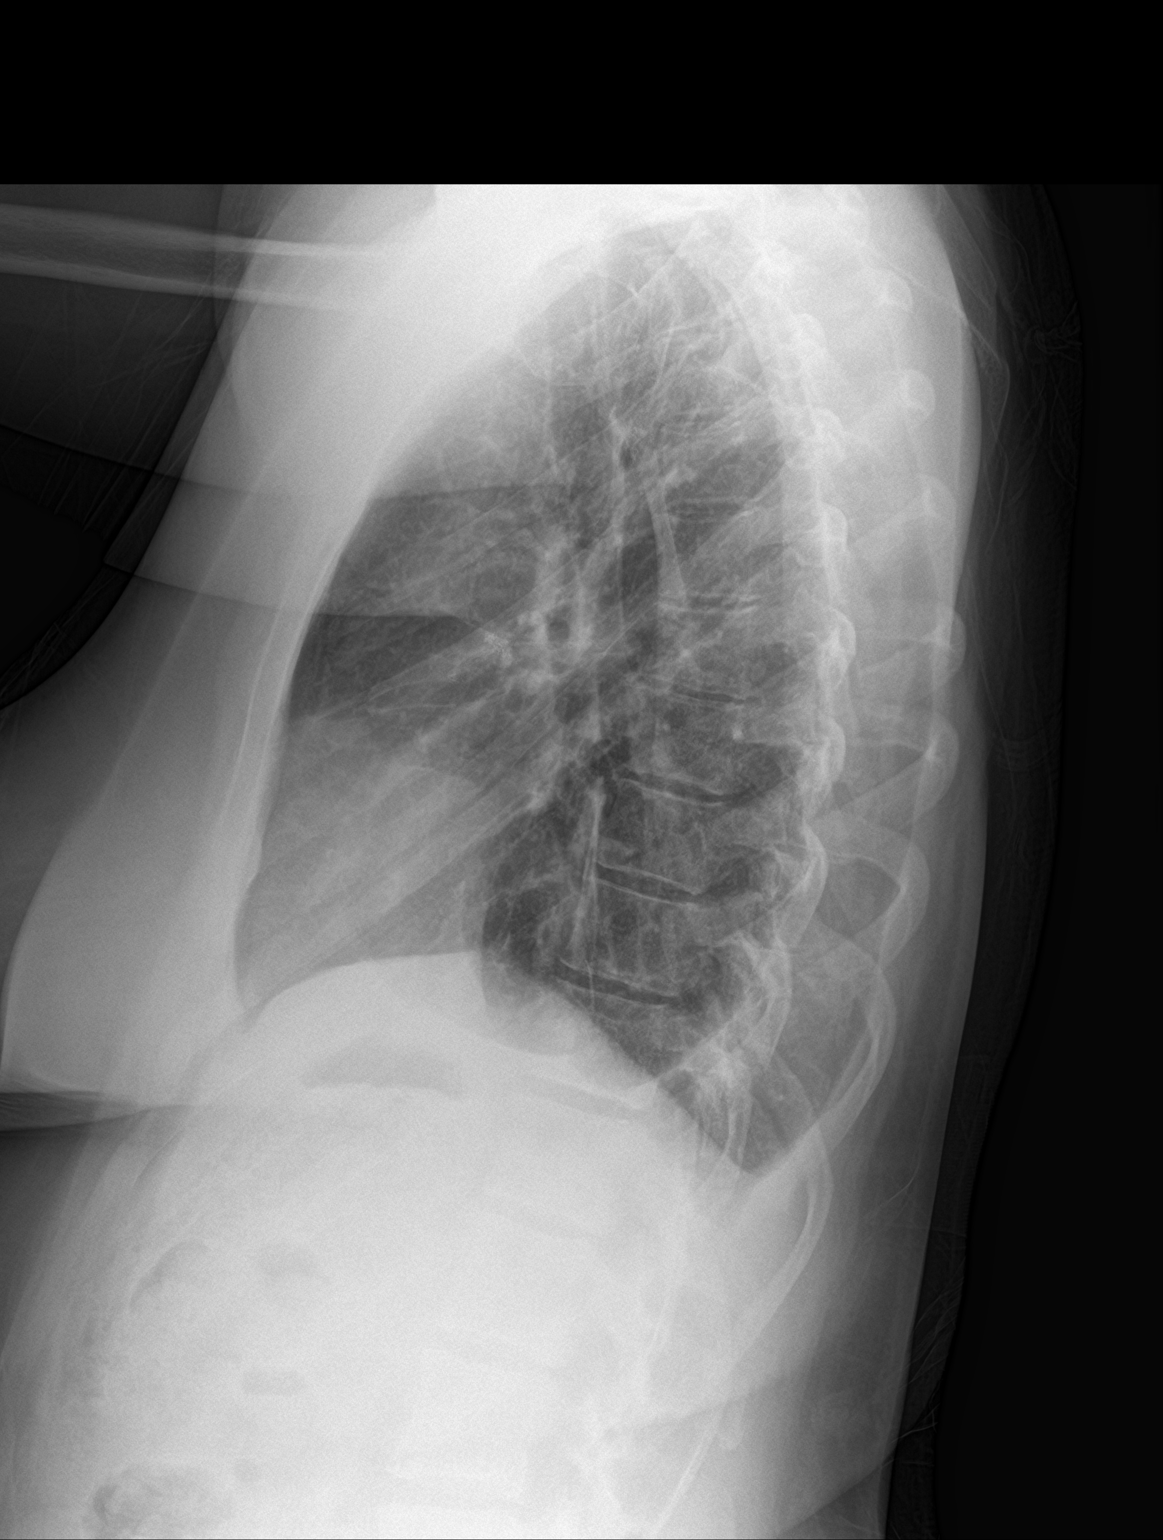

[2 of 2 positions shown; findings below may reference images not displayed]

FINDINGS: Normal heart size, mediastinal contours, and pulmonary vascularity.

Subsegmental atelectasis and small residual pleural effusion at LEFT
base.

Lungs appear minimally hyperinflated but otherwise clear.

No pneumothorax or acute infiltrate.

Bones unremarkable.
IMPRESSION: Postsurgical changes at inferior LEFT hemithorax with persistent
small pleural effusion and basilar atelectasis.

## 2020-03-18 ENCOUNTER — Other Ambulatory Visit: Payer: Self-pay

## 2020-03-18 ENCOUNTER — Emergency Department
Admission: EM | Admit: 2020-03-18 | Discharge: 2020-03-18 | Disposition: A | Discharge status: Home / Self Care | Payer: Self-pay | Attending: Emergency Medicine | Admitting: Emergency Medicine

## 2020-03-18 ENCOUNTER — Emergency Department: Payer: Self-pay

## 2020-03-18 ENCOUNTER — Encounter: Payer: Self-pay | Admitting: Emergency Medicine

## 2020-03-18 DIAGNOSIS — R0602 Shortness of breath: Secondary | ICD-10-CM | POA: Insufficient documentation

## 2020-03-18 DIAGNOSIS — R0789 Other chest pain: Secondary | ICD-10-CM | POA: Insufficient documentation

## 2020-03-18 DIAGNOSIS — Z91040 Latex allergy status: Secondary | ICD-10-CM | POA: Insufficient documentation

## 2020-03-18 DIAGNOSIS — R05 Cough: Secondary | ICD-10-CM | POA: Insufficient documentation

## 2020-03-18 LAB — BASIC METABOLIC PANEL
Anion gap: 7 (ref 5–15)
BUN: 13 mg/dL (ref 6–20)
CO2: 23 mmol/L (ref 22–32)
Calcium: 9.2 mg/dL (ref 8.9–10.3)
Chloride: 108 mmol/L (ref 98–111)
Creatinine, Ser: 0.82 mg/dL (ref 0.44–1.00)
GFR calc Af Amer: >60 mL/min (ref >60)
GFR calc non Af Amer: >60 mL/min (ref >60)
Glucose, Bld: 103 mg/dL — ABNORMAL HIGH (ref 70–99)
Potassium: 4.1 mmol/L (ref 3.5–5.1)
Sodium: 138 mmol/L (ref 135–145)

## 2020-03-18 LAB — CBC
HCT: 40 % (ref 36.0–46.0)
Hemoglobin: 13.4 g/dL (ref 12.0–15.0)
MCH: 29.9 pg (ref 26.0–34.0)
MCHC: 33.5 g/dL (ref 30.0–36.0)
MCV: 89.3 fL (ref 80.0–100.0)
Platelets: 251 10*3/uL (ref 150–400)
RBC: 4.48 MIL/uL (ref 3.87–5.11)
RDW: 12.3 % (ref 11.5–15.5)
WBC: 9.8 10*3/uL (ref 4.0–10.5)
nRBC: 0 % (ref 0.0–0.2)

## 2020-03-18 LAB — TROPONIN I (HIGH SENSITIVITY): Troponin I (High Sensitivity): <2 ng/L (ref <18)

## 2020-03-18 LAB — PREGNANCY, URINE: Preg Test, Ur: NEGATIVE

## 2020-03-18 MED ORDER — LEVOFLOXACIN 750 MG PO TABS: 750.0000 mg | ORAL_TABLET | Freq: Every day | ORAL | 0 refills | Status: DC

## 2020-03-18 MED ORDER — SODIUM CHLORIDE 0.9% FLUSH: 3.0000 mL | Freq: Once | INTRAVENOUS | Status: DC

## 2020-03-18 MED ORDER — HYDROCOD POLST-CPM POLST ER 10-8 MG/5ML PO SUER: 5.0000 mL | Freq: Two times a day (BID) | ORAL | 0 refills | Status: DC

## 2020-03-18 NOTE — ED Provider Notes (Signed)
[facility] emergency department provider note       Time seen: ----------------------------------------- 11:26 AM on 03/18/2020 -----------------------------------------  I have reviewed the vital signs and the nursing notes.  HISTORY   Chief Complaint Chest Pain and Shortness of Breath   HPI Alicia Whitney is a 34 y.o. female with a history of anxiety, bipolar disorder, depression, seizures who presents to the clinic for cough for the last month with shortness of breath and substernal chest pain.  Patient states she has had chest pain for several days is in the center of her chest.  Cough is nonproductive.  Had a history of lung surgery for Mycobacterium avium complex.  She reports some lingering pain in the left chest from same.  Discomfort is 8 out of 10.  Past Medical History:  Diagnosis Date  . Anxiety   . Bipolar disorder ([place])   . Depression   . Seizure ([place])    last around age 25    Past Surgical History:  Procedure Laterality Date  . CESAREAN SECTION    . DILATION AND CURETTAGE OF UTERUS    . THORACOTOMY Left 11/20/2018   Procedure: PREOP BRONCHOSCOPY, THORACOTOMY AND LUNG RESECTION;  Surgeon: Nestor Lewandowsky, MD;  Location: [facility] ORS;  Service: General;  Laterality: Left;  . TONSILLECTOMY    . TUBAL LIGATION      Allergies Morphine and related, Onion, and Latex   Review of Systems Constitutional: Negative for fever. Cardiovascular: Positive for chest pain Respiratory: Positive for shortness of breath Gastrointestinal: Negative for abdominal pain, vomiting and diarrhea. Musculoskeletal: Negative for back pain. Skin: Negative for rash. Neurological: Negative for headaches, focal weakness or numbness.  All systems negative/normal/unremarkable except as stated in the HPI  ____________________________________________   PHYSICAL EXAM:  VITAL SIGNS: ED Triage Vitals [03/18/20 0924]  Enc Vitals Group     BP (!) 116/51     Pulse Rate 94     Resp (!) 26   Temp 98.1 F (36.7 C)     Temp Source Oral     SpO2 99 %     Weight 180 lb (81.6 kg)     Height _0  (1.676 m)     Head Circumference      Peak Flow      Pain Score 8     Pain Loc      Pain Edu?      Excl. in [place]?     Constitutional: Alert and oriented. Well appearing and in no distress. Eyes: Conjunctivae are normal. Normal extraocular movements. ENT      Head: Normocephalic and atraumatic.      Nose: No congestion/rhinnorhea.      Mouth/Throat: Mucous membranes are moist.      Neck: No stridor. Cardiovascular: Normal rate, regular rhythm. No murmurs, rubs, or gallops.  Previous incision sites on the chest wall are unremarkable Respiratory: Normal respiratory effort without tachypnea nor retractions. Breath sounds are clear and equal bilaterally. No wheezes/rales/rhonchi. Gastrointestinal: Soft and nontender. Normal bowel sounds Musculoskeletal: Nontender with normal range of motion in extremities. No lower extremity tenderness nor edema. Neurologic:  Normal speech and language. No gross focal neurologic deficits are appreciated.  Skin:  Skin is warm, dry and intact. No rash noted. Psychiatric: Speech and behavior are normal.  ____________________________________________  EKG: Interpreted by me.  Sinus rhythm.  95 bpm, normal PR interval, normal QRS, normal QT  ____________________________________________   LABS (pertinent positives/negatives)  Labs Reviewed  BASIC METABOLIC PANEL - Abnormal; Notable for the  following components:      Result Value   Glucose, Bld 103 (*)    All other components within normal limits  CBC  PREGNANCY, URINE  TROPONIN I (HIGH SENSITIVITY)   RADIOLOGY:  CXR IMPRESSION: No active cardiopulmonary disease. Postsurgical changes in the left mid lung.   ASSESSMENT AND PLAN  Chest pain, cough   Plan: The patient had presented for chest pain and cough. Patient's labs are reassuring.  I will place her on antibiotics given her previous  history.  She is encouraged to have close outpatient follow-up with her pulmonologist or thoracic surgeon.  Lenise Arena MD    Note: This note was generated in part or whole with voice recognition software. Voice recognition is usually quite accurate but there are transcription errors that can and very often do occur. I apologize for any typographical errors that were not detected and corrected.     Earleen Newport, MD 03/18/20 [phone]

## 2020-03-18 NOTE — ED Triage Notes (Addendum)
First RN note: Pt presents to ED via POV, ambulatory to desk without difficulty, reports cough x 1 month, SOB, and substernal CP. Pt states CP x several days. Pt states cough is non-productive. Pt states hx of lung surgery at this time.   Pt reports hx of cancer, also reports hx of thoracotomy and removal of 2 "chunks of left lung" at this time.

## 2020-04-19 ENCOUNTER — Encounter: Payer: Self-pay | Admitting: Medical Oncology

## 2020-04-19 ENCOUNTER — Other Ambulatory Visit: Payer: Self-pay

## 2020-04-19 ENCOUNTER — Emergency Department
Admission: EM | Admit: 2020-04-19 | Discharge: 2020-04-20 | Disposition: A | Discharge status: Other Acute Inpatient Hospital | Payer: Self-pay | Attending: Emergency Medicine | Admitting: Emergency Medicine

## 2020-04-19 DIAGNOSIS — Z79899 Other long term (current) drug therapy: Secondary | ICD-10-CM | POA: Insufficient documentation

## 2020-04-19 DIAGNOSIS — F332 Major depressive disorder, recurrent severe without psychotic features: Secondary | ICD-10-CM | POA: Diagnosis present

## 2020-04-19 DIAGNOSIS — F1210 Cannabis abuse, uncomplicated: Secondary | ICD-10-CM | POA: Diagnosis present

## 2020-04-19 DIAGNOSIS — F1510 Other stimulant abuse, uncomplicated: Secondary | ICD-10-CM | POA: Insufficient documentation

## 2020-04-19 DIAGNOSIS — F319 Bipolar disorder, unspecified: Secondary | ICD-10-CM | POA: Insufficient documentation

## 2020-04-19 DIAGNOSIS — R918 Other nonspecific abnormal finding of lung field: Secondary | ICD-10-CM | POA: Diagnosis present

## 2020-04-19 DIAGNOSIS — R0600 Dyspnea, unspecified: Secondary | ICD-10-CM | POA: Diagnosis present

## 2020-04-19 DIAGNOSIS — F4312 Post-traumatic stress disorder, chronic: Secondary | ICD-10-CM | POA: Insufficient documentation

## 2020-04-19 DIAGNOSIS — F4310 Post-traumatic stress disorder, unspecified: Secondary | ICD-10-CM | POA: Diagnosis present

## 2020-04-19 DIAGNOSIS — Z20822 Contact with and (suspected) exposure to covid-19: Secondary | ICD-10-CM | POA: Insufficient documentation

## 2020-04-19 DIAGNOSIS — F1420 Cocaine dependence, uncomplicated: Secondary | ICD-10-CM | POA: Diagnosis present

## 2020-04-19 DIAGNOSIS — F1120 Opioid dependence, uncomplicated: Secondary | ICD-10-CM | POA: Diagnosis present

## 2020-04-19 DIAGNOSIS — R45851 Suicidal ideations: Secondary | ICD-10-CM

## 2020-04-19 LAB — COMPREHENSIVE METABOLIC PANEL
ALT: 17 U/L (ref 0–44)
AST: 18 U/L (ref 15–41)
Albumin: 4.3 g/dL (ref 3.5–5.0)
Alkaline Phosphatase: 91 U/L (ref 38–126)
Anion gap: 7 (ref 5–15)
BUN: 12 mg/dL (ref 6–20)
CO2: 29 mmol/L (ref 22–32)
Calcium: 9.3 mg/dL (ref 8.9–10.3)
Chloride: 104 mmol/L (ref 98–111)
Creatinine, Ser: 0.93 mg/dL (ref 0.44–1.00)
GFR calc Af Amer: >60 mL/min (ref >60)
GFR calc non Af Amer: >60 mL/min (ref >60)
Glucose, Bld: 102 mg/dL — ABNORMAL HIGH (ref 70–99)
Potassium: 3.9 mmol/L (ref 3.5–5.1)
Sodium: 140 mmol/L (ref 135–145)
Total Bilirubin: 0.6 mg/dL (ref 0.3–1.2)
Total Protein: 7.8 g/dL (ref 6.5–8.1)

## 2020-04-19 LAB — ACETAMINOPHEN LEVEL: Acetaminophen (Tylenol), Serum: <10 ug/mL — ABNORMAL LOW (ref 10–30)

## 2020-04-19 LAB — CBC
HCT: 42.8 % (ref 36.0–46.0)
Hemoglobin: 14.2 g/dL (ref 12.0–15.0)
MCH: 29.9 pg (ref 26.0–34.0)
MCHC: 33.2 g/dL (ref 30.0–36.0)
MCV: 90.1 fL (ref 80.0–100.0)
Platelets: 235 10*3/uL (ref 150–400)
RBC: 4.75 MIL/uL (ref 3.87–5.11)
RDW: 12.7 % (ref 11.5–15.5)
WBC: 10.9 10*3/uL — ABNORMAL HIGH (ref 4.0–10.5)
nRBC: 0 % (ref 0.0–0.2)

## 2020-04-19 LAB — URINE DRUG SCREEN, QUALITATIVE (ARMC ONLY)
Amphetamines, Ur Screen: POSITIVE — AB
Barbiturates, Ur Screen: NOT DETECTED
Benzodiazepine, Ur Scrn: NOT DETECTED
Cannabinoid 50 Ng, Ur ~~LOC~~: NOT DETECTED
Cocaine Metabolite,Ur ~~LOC~~: NOT DETECTED
MDMA (Ecstasy)Ur Screen: NOT DETECTED
Methadone Scn, Ur: NOT DETECTED
Opiate, Ur Screen: NOT DETECTED
Phencyclidine (PCP) Ur S: NOT DETECTED
Tricyclic, Ur Screen: NOT DETECTED

## 2020-04-19 LAB — ETHANOL: Alcohol, Ethyl (B): <10 mg/dL (ref <10)

## 2020-04-19 LAB — SARS CORONAVIRUS 2 BY RT PCR (HOSPITAL ORDER, PERFORMED IN ~~LOC~~ HOSPITAL LAB): SARS Coronavirus 2: NEGATIVE

## 2020-04-19 LAB — SALICYLATE LEVEL: Salicylate Lvl: <7 mg/dL — ABNORMAL LOW (ref 7.0–30.0)

## 2020-04-19 LAB — POCT PREGNANCY, URINE: Preg Test, Ur: NEGATIVE

## 2020-04-19 MED ORDER — ACETAMINOPHEN 325 MG PO TABS
650.0000 mg | ORAL_TABLET | Freq: Once | ORAL | Status: AC
  Administered 2020-04-19: 650 mg via ORAL
  Filled 2020-04-19: qty 2

## 2020-04-19 MED ORDER — TRAZODONE HCL 50 MG PO TABS
150.0000 mg | ORAL_TABLET | Freq: Every day | ORAL | Status: DC
  Administered 2020-04-20: 150 mg via ORAL
  Filled 2020-04-19: qty 1

## 2020-04-19 MED ORDER — HYDROXYZINE HCL 25 MG PO TABS
25.0000 mg | ORAL_TABLET | Freq: Once | ORAL | Status: AC
  Administered 2020-04-19: 25 mg via ORAL
  Filled 2020-04-19: qty 1

## 2020-04-19 NOTE — ED Notes (Signed)
Patients Items:  Gold purse with phone,folders,water bottle,and white gold diamond ring. Gray/Black Camo sweatshirt  [employer] Sandals Purple Bra Pink Shirt Black and White Hair tie
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
## 2020-04-19 NOTE — ED Notes (Signed)
Sandwich tray given a a drink
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
## 2020-04-20 ENCOUNTER — Inpatient Hospital Stay
Admission: EM | Admit: 2020-04-20 | Discharge: 2020-04-22 | DRG: 885 | Disposition: A | Discharge status: Home / Self Care | Payer: No Typology Code available for payment source | Attending: Psychiatry | Admitting: Psychiatry

## 2020-04-20 DIAGNOSIS — F1510 Other stimulant abuse, uncomplicated: Secondary | ICD-10-CM | POA: Diagnosis present

## 2020-04-20 DIAGNOSIS — Z91040 Latex allergy status: Secondary | ICD-10-CM

## 2020-04-20 DIAGNOSIS — Z91018 Allergy to other foods: Secondary | ICD-10-CM | POA: Diagnosis not present

## 2020-04-20 DIAGNOSIS — Z87891 Personal history of nicotine dependence: Secondary | ICD-10-CM

## 2020-04-20 DIAGNOSIS — F4310 Post-traumatic stress disorder, unspecified: Secondary | ICD-10-CM | POA: Diagnosis present

## 2020-04-20 DIAGNOSIS — R45851 Suicidal ideations: Secondary | ICD-10-CM | POA: Diagnosis present

## 2020-04-20 DIAGNOSIS — F319 Bipolar disorder, unspecified: Secondary | ICD-10-CM

## 2020-04-20 DIAGNOSIS — Z79899 Other long term (current) drug therapy: Secondary | ICD-10-CM | POA: Diagnosis not present

## 2020-04-20 DIAGNOSIS — Z885 Allergy status to narcotic agent status: Secondary | ICD-10-CM | POA: Diagnosis not present

## 2020-04-20 DIAGNOSIS — F3130 Bipolar disorder, current episode depressed, mild or moderate severity, unspecified: Principal | ICD-10-CM | POA: Diagnosis present

## 2020-04-20 DIAGNOSIS — Z9851 Tubal ligation status: Secondary | ICD-10-CM | POA: Diagnosis not present

## 2020-04-20 DIAGNOSIS — Z20822 Contact with and (suspected) exposure to covid-19: Secondary | ICD-10-CM | POA: Diagnosis present

## 2020-04-20 DIAGNOSIS — R569 Unspecified convulsions: Secondary | ICD-10-CM | POA: Diagnosis present

## 2020-04-20 MED ORDER — ALUM & MAG HYDROXIDE-SIMETH 200-200-20 MG/5ML PO SUSP: 30.0000 mL | ORAL | Status: DC | PRN

## 2020-04-20 MED ORDER — TRAZODONE HCL 50 MG PO TABS
75.0000 mg | ORAL_TABLET | Freq: Every day | ORAL | Status: DC
  Administered 2020-04-20 – 2020-04-21 (×2): 75 mg via ORAL
  Filled 2020-04-20 (×2): qty 2

## 2020-04-20 MED ORDER — HYDROXYZINE HCL 25 MG PO TABS
25.0000 mg | ORAL_TABLET | Freq: Three times a day (TID) | ORAL | Status: DC | PRN
  Administered 2020-04-21: 25 mg via ORAL
  Filled 2020-04-20: qty 1

## 2020-04-20 MED ORDER — MAGNESIUM HYDROXIDE 400 MG/5ML PO SUSP: 30.0000 mL | Freq: Every day | ORAL | Status: DC | PRN

## 2020-04-20 MED ORDER — SERTRALINE HCL 25 MG PO TABS
50.0000 mg | ORAL_TABLET | Freq: Every day | ORAL | Status: DC
  Administered 2020-04-20 – 2020-04-21 (×2): 50 mg via ORAL
  Filled 2020-04-20 (×2): qty 2

## 2020-04-20 MED ORDER — PRAZOSIN HCL 1 MG PO CAPS
1.0000 mg | ORAL_CAPSULE | Freq: Every day | ORAL | Status: DC
  Administered 2020-04-20 – 2020-04-21 (×2): 1 mg via ORAL
  Filled 2020-04-20 (×2): qty 1

## 2020-04-20 MED ORDER — ACETAMINOPHEN 325 MG PO TABS: 650.0000 mg | ORAL_TABLET | Freq: Four times a day (QID) | ORAL | Status: DC | PRN

## 2020-04-20 MED ORDER — PROMETHAZINE HCL 25 MG PO TABS
12.5000 mg | ORAL_TABLET | Freq: Four times a day (QID) | ORAL | Status: DC | PRN
  Administered 2020-04-20 – 2020-04-21 (×2): 12.5 mg via ORAL
  Filled 2020-04-20 (×2): qty 1

## 2020-04-20 NOTE — Plan of Care (Signed)
Pt rates depression 9/10 and anxiety 8/10. Pt denies SI, HI and AVH. Pt was educated on care plan and verbalizes understanding. Torrie Mayers RN Problem: Education: Goal: Knowledge of disease or condition will improve Outcome: Progressing Goal: Understanding of discharge needs will improve Outcome: Progressing   Problem: Health Behavior/Discharge Planning: Goal: Ability to identify changes in lifestyle to reduce recurrence of condition will improve Outcome: Progressing Goal: Identification of resources available to assist in meeting health care needs will improve Outcome: Progressing   Problem: Physical Regulation: Goal: Complications related to the disease process, condition or treatment will be avoided or minimized Outcome: Progressing   Problem: Safety: Goal: Ability to remain free from injury will improve Outcome: Progressing

## 2020-04-20 NOTE — Progress Notes (Signed)
Recreation Therapy Notes    Date: 04/20/2020  Time: 9:30 am   Location: Craft room    Behavioral response: N/A   Intervention Topic: Decision-making   Discussion/Intervention: Patient did not attend group.   Clinical Observations/Feedback:  Patient did not attend group.   Erma Joubert LRT/CTRS        Janiene Aarons 04/20/2020 11:54 AM

## 2020-04-20 NOTE — Tx Team (Signed)
Initial Treatment Plan 04/20/2020 4:33 AM [name] JQZ:[id number]    PATIENT STRESSORS: Loss of children  Marital or family conflict Medication change or noncompliance Substance abuse   PATIENT STRENGTHS: General fund of knowledge Motivation for treatment/growth Supportive family/friends   PATIENT IDENTIFIED PROBLEMS: Suicidal Ideation     Depression     Substance Abuse              DISCHARGE CRITERIA:  Improved stabilization in mood, thinking, and/or behavior Motivation to continue treatment in a less acute level of care  PRELIMINARY DISCHARGE PLAN: Outpatient therapy  PATIENT/FAMILY INVOLVEMENT: This treatment plan has been presented to and reviewed with the patient, Alicia Whitney, The patient and family have been given the opportunity to ask questions and make suggestions.  Trula Ore, RN 04/20/2020, 4:33 AM
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
## 2020-04-20 NOTE — Progress Notes (Signed)
Admission Note:  34 yr Female who presents IVC in no acute distress for the treatment of SI and Depression. Patient upon arrival was tearful and sad, she appears flat and depressed, she was offered emotional support,  cooperative with admission process.  Patient currently denies SIHI/AVH  and contracts for safety upon admission.  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 Patient has past    medical Hx of PTSD, Depression, Substance Abuse,, Dyspnea, and Lung mass.  Skin was assessed and found to be clear of any abnormal marks, skin was warm and intact she was searched and no contraband found, POC and unit policies explained and understanding verbalized. Consent obtained. Food and fluids offered, 15 minutes safety checks maintained will continue to monitor.
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
## 2020-04-20 NOTE — Progress Notes (Signed)
D- Patient alert and oriented. Affect/mood is appropriate to circumstance and pleasant. Pt denies SI, HI, AVH, and pain. Pt states that she "did not sleep for a week" Pt says that she "feels better, just need to get some sleep". Pt met her goal this morning of sleeping.   A- Scheduled medications administered to patient, per MD orders. Support and encouragement provided.  Routine safety checks conducted every 15 minutes.  Patient informed to notify staff with problems or concerns.  R- No adverse drug reactions noted. Patient contracts for safety at this time. Patient compliant with medications and treatment plan. Patient receptive, calm, and cooperative. Patient interacts well with others on the unit.  Patient remains safe at this time.  Collier Bullock RN

## 2020-04-20 NOTE — BH Assessment (Signed)
Patient is to be admitted to [facility] by Psychiatric Nurse Practitioner Gillermo Murdoch.  Attending Physician will be Dr. Toni Amend.   Patient has been assigned to room 323, by [facility] Charge Nurse Molli Hazard.   ER staff is aware of the admission:  Glenda, ER Secretary    Dr. Larinda Buttery, ER MD   Cala Bradford Patient's Nurse   Lauren Patient Access.
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
## 2020-04-21 NOTE — Progress Notes (Signed)
Recreation Therapy Notes  Date: 04/21/2020  Time: 9:30 am  Location: Room 21   Behavioral response: Appropriate   Intervention Topic: Animal Assisted Therapy   Discussion/Intervention:  Animal Assisted Therapy took place today during group.  Animal Assisted Therapy is the planned inclusion of an animal in a patients treatment plan. The patients were able to engage in therapy with an animal during group. Participants were educated on what a service dog is and the different between a support dog and a service dog. Patient were informed on the many animal needs there are and how their needs are similar. Individuals were enlightened on the process to get a service animal or support animal. Patients got the opportunity to pet the animal and were offered emotional support from the animal and staff.  Clinical Observations/Feedback:  Patient came to group and was on topic and was focused on what peers and staff had to say. Participant shared their experiences and history with animals. Individual was social with peers, staff and animal while participating in group.  Alicia Whitney LRT/CTRS         Alicia Whitney 04/21/2020 11:06 AM

## 2020-04-21 NOTE — [facility] Suicide Risk Assessment (Signed)
[facility] INPATIENT:  Family/Significant Other Suicide Prevention Education  Suicide Prevention Education:  Education Completed; Dallas Schimke, husband, [phone], has been identified by the patient as the family member/significant other with whom the patient will be residing, and identified as the person(s) who will aid the patient in the event of a mental health crisis (suicidal ideations/suicide attempt).  With written consent from the patient, the family member/significant other has been provided the following suicide prevention education, prior to the and/or following the discharge of the patient.  The suicide prevention education provided includes the following:  Suicide risk factors  Suicide prevention and interventions  National Suicide Hotline telephone number  [facility] assessment telephone number  [facility] Emergency Assistance 911  [facility] and/or Residential Mobile Crisis Unit telephone number  Request made of family/significant other to:  Remove weapons (e.g., guns, rifles, knives), all items previously/currently identified as safety concern.    Remove drugs/medications (over-the-counter, prescriptions, illicit drugs), all items previously/currently identified as a safety concern.  The family member/significant other verbalizes understanding of the suicide prevention education information provided.  The family member/significant other agrees to remove the items of safety concern listed above.  Harden Mo 04/21/2020, 2:12 PM

## 2020-04-21 NOTE — Progress Notes (Signed)
Patient alert and oriented x 4, affect is blunted thoughts are organized and coherent, she denies SI/HI/AVH stated " I have ben sleeping all day but I really need it" she appears less anxious and receptive to staff. No distress noted 15 minutes safety checks maintained will continue to monitor.

## 2020-04-21 NOTE — Plan of Care (Signed)
Data: Patient is appropriate and cooperative to assessment. Patient denies SI/HI/AVH. Patient has not completed daily self inventory worksheet. Patient reports improving anxiety. Patient rates depression "4/10" , feelings of hopelessness "0/10" and anxiety "3/10" Patients goal for today is "get out of bed."  Action:  Q x 15 minute observation checks were completed for safety. Patient was provided with education on medications. Patient was offered support and encouragement. Patient was given scheduled medications. Patient  was encourage to attend groups, participate in unit activities and continue with plan of care.    Response:Patient has no complaints at this time. Patient is receptive to treatment and safety maintained on unit.    Problem: Health Behavior/Discharge Planning: Goal: Ability to identify changes in lifestyle to reduce recurrence of condition will improve Outcome: Progressing

## 2020-04-21 NOTE — Progress Notes (Signed)
[facility] MD Progress Note  04/21/2020 3:41 PM Alicia Whitney  MRN:  [id number] Subjective: Follow-up for this patient with depression and substance abuse.  Patient attended treatment team today.  He reports that she is feeling better.  Got some rest yesterday.  Feels better being back on medication.  Denies any acute suicidal ideation.  Has been speaking with her husband and working on plans for therapy. Principal Problem: Bipolar depression (HCC) Diagnosis: Principal Problem:   Bipolar depression (HCC) Active Problems:   PTSD (post-traumatic stress disorder)   Amphetamine abuse (HCC)  Total Time spent with patient: 30 minutes  Past Psychiatric History: Past history of recurrent depression but mainly lots of substance abuse  Past Medical History:  Past Medical History:  Diagnosis Date  . Anxiety   . Bipolar disorder (HCC)   . Depression   . Seizure (HCC)    last around age 42    Past Surgical History:  Procedure Laterality Date  . CESAREAN SECTION    . DILATION AND CURETTAGE OF UTERUS    . THORACOTOMY Left 11/20/2018   Procedure: PREOP BRONCHOSCOPY, THORACOTOMY AND LUNG RESECTION;  Surgeon: Hulda Marin, MD;  Location: [facility] ORS;  Service: General;  Laterality: Left;  . THORACOTOMY    . TONSILLECTOMY    . TUBAL LIGATION     Family History:  Family History  Problem Relation Age of Onset  . 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 Marital status: Married    Spouse name: Not on file  . Number of children: Not on file  . Years of education: Not on file  . Highest education level: Not on file  Occupational History  . Not on file  Tobacco Use  . Smoking status: Former Smoker    Packs/day: 0.25    Years:  1.00    Pack years: 0.25    Types: Cigarettes    Quit date: 2012    Years since quitting: 9.4  . Smokeless tobacco: Never Used  Vaping Use  . Vaping Use: Never used  Substance and Sexual Activity  . Alcohol use: Not Currently    Comment: sober since 2018  . Drug use: Not Currently  . Sexual activity: Not on file  Other Topics Concern  . Not on file  Social History Narrative  . Not on file   Social Determinants of Health   Financial Resource Strain:   . Difficulty of Paying Living Expenses:   Food Insecurity:   . Worried About Programme researcher, broadcasting/film/video in the Last Year:   . Barista in the Last Year:   Transportation Needs:   . Freight forwarder (Medical):   Marland Kitchen Lack of Transportation (Non-Medical):   Physical Activity:   . Days of Exercise per Week:   . Minutes of Exercise per Session:   Stress:   . Feeling of Stress :   Social Connections:   . Frequency of Communication with Friends and Family:   . Frequency of Social Gatherings with Friends and Family:   . Attends Religious Services:   . Active Member of Clubs or Organizations:   . Attends Banker Meetings:   Marland Kitchen Marital Status:  Additional Social History:                         Sleep: Fair  Appetite:  Fair  Current Medications: Current Facility-Administered Medications  Medication Dose Route Frequency Provider Last Rate Last Admin  . acetaminophen (TYLENOL) tablet 650 mg  650 mg Oral Q6H PRN Gillermo Murdoch, NP      . alum & mag hydroxide-simeth (MAALOX/MYLANTA) 200-200-20 MG/5ML suspension 30 mL  30 mL Oral Q4H PRN Gillermo Murdoch, NP      . hydrOXYzine (ATARAX/VISTARIL) tablet 25 mg  25 mg Oral Q8H PRN Gillermo Murdoch, NP      . magnesium hydroxide (MILK OF MAGNESIA) suspension 30 mL  30 mL Oral Daily PRN Gillermo Murdoch, NP      . prazosin (MINIPRESS) capsule 1 mg  1 mg Oral QHS Brynn Reznik, Jackquline Denmark, MD   1 mg at 04/20/20 2150  . promethazine (PHENERGAN) tablet  12.5 mg  12.5 mg Oral Q6H PRN Elane Peabody T, MD   12.5 mg at 04/21/20 1140  . sertraline (ZOLOFT) tablet 50 mg  50 mg Oral QHS Gillermo Murdoch, NP   50 mg at 04/20/20 2149  . traZODone (DESYREL) tablet 75 mg  75 mg Oral QHS Gillermo Murdoch, NP   75 mg at 04/20/20 2149    Lab Results:  Results for orders placed or performed during the hospital encounter of 04/19/20 (from the past 48 hour(s))  Comprehensive metabolic panel     Status: Abnormal   Collection Time: 04/19/20  6:03 PM  Result Value Ref Range   Sodium 140 135 - 145 mmol/L   Potassium 3.9 3.5 - 5.1 mmol/L   Chloride 104 98 - 111 mmol/L   CO2 29 22 - 32 mmol/L   Glucose, Bld 102 (H) 70 - 99 mg/dL    Comment: Glucose reference range applies only to samples taken after fasting for at least 8 hours.   BUN 12 6 - 20 mg/dL   Creatinine, Ser 4.09 0.44 - 1.00 mg/dL   Calcium 9.3 8.9 - 81.1 mg/dL   Total Protein 7.8 6.5 - 8.1 g/dL   Albumin 4.3 3.5 - 5.0 g/dL   AST 18 15 - 41 U/L   ALT 17 0 - 44 U/L   Alkaline Phosphatase 91 38 - 126 U/L   Total Bilirubin 0.6 0.3 - 1.2 mg/dL   GFR calc non Af Amer >60 >60 mL/min   GFR calc Af Amer >60 >60 mL/min   Anion gap 7 5 - 15    Comment: Performed at [facility], [address], [place], [place] [address]  Ethanol     Status: None   Collection Time: 04/19/20  6:03 PM  Result Value Ref Range   Alcohol, Ethyl (B) <10 <10 mg/dL    Comment: (NOTE) Lowest detectable limit for serum alcohol is 10 mg/dL.  For medical purposes only. Performed at [facility], [address] [place], [place] [address]   Salicylate level     Status: Abnormal   Collection Time: 04/19/20  6:03 PM  Result Value Ref Range   Salicylate Lvl <7.0 (L) 7.0 - 30.0 mg/dL    Comment: Performed at [facility], [address] [place], [place] [address]  Acetaminophen level     Status: Abnormal   Collection Time: 04/19/20  6:03 PM  Result Value Ref Range   Acetaminophen  (Tylenol), Serum <10 (L) 10 - 30 ug/mL  Comment: (NOTE) Therapeutic concentrations vary significantly. A range of 10-30 ug/mL  may be an effective concentration for many patients. However, some  are best treated at concentrations outside of this range. Acetaminophen concentrations >150 ug/mL at 4 hours after ingestion  and >50 ug/mL at 12 hours after ingestion are often associated with  toxic reactions.  Performed at [facility], [address] [place], [place] [address]   cbc     Status: Abnormal   Collection Time: 04/19/20  6:03 PM  Result Value Ref Range   WBC 10.9 (H) 4.0 - 10.5 K/uL   RBC 4.75 3.87 - 5.11 MIL/uL   Hemoglobin 14.2 12.0 - 15.0 g/dL   HCT 42.8 36 - 46 %   MCV 90.1 80.0 - 100.0 fL   MCH 29.9 26.0 - 34.0 pg   MCHC 33.2 30.0 - 36.0 g/dL   RDW 12.7 11.5 - 15.5 %   Platelets 235 150 - 400 K/uL   nRBC 0.0 0.0 - 0.2 %    Comment: Performed at [facility], [address], [place], [place] [address]  Urine Drug Screen, Qualitative     Status: Abnormal   Collection Time: 04/19/20  6:03 PM  Result Value Ref Range   Tricyclic, Ur Screen NONE DETECTED NONE DETECTED   Amphetamines, Ur Screen POSITIVE (A) NONE DETECTED   MDMA (Ecstasy)Ur Screen NONE DETECTED NONE DETECTED   Cocaine Metabolite,Ur [place] NONE DETECTED NONE DETECTED   Opiate, Ur Screen NONE DETECTED NONE DETECTED   Phencyclidine (PCP) Ur S NONE DETECTED NONE DETECTED   Cannabinoid 50 Ng, Ur [place] NONE DETECTED NONE DETECTED   Barbiturates, Ur Screen NONE DETECTED NONE DETECTED   Benzodiazepine, Ur Scrn NONE DETECTED NONE DETECTED   Methadone Scn, Ur NONE DETECTED NONE DETECTED    Comment: (NOTE) Tricyclics + metabolites, urine    Cutoff 1000 ng/mL Amphetamines + metabolites, urine  Cutoff 1000 ng/mL MDMA (Ecstasy), urine              Cutoff 500 ng/mL Cocaine Metabolite, urine          Cutoff 300 ng/mL Opiate + metabolites, urine        Cutoff 300 ng/mL Phencyclidine (PCP), urine          Cutoff 25 ng/mL Cannabinoid, urine                 Cutoff 50 ng/mL Barbiturates + metabolites, urine  Cutoff 200 ng/mL Benzodiazepine, urine              Cutoff 200 ng/mL Methadone, urine                   Cutoff 300 ng/mL  The urine drug screen provides only a preliminary, unconfirmed analytical test result and should not be used for non-medical purposes. Clinical consideration and professional judgment should be applied to any positive drug screen result due to possible interfering substances. A more specific alternate chemical method must be used in order to obtain a confirmed analytical result. Gas chromatography / mass spectrometry (GC/MS) is the preferred confirm atory method. Performed at [facility], [address] [place], [place] [address]   Pregnancy, urine POC     Status: None   Collection Time: 04/19/20  6:29 PM  Result Value Ref Range   Preg Test, Ur NEGATIVE NEGATIVE    Comment:        THE SENSITIVITY OF THIS METHODOLOGY IS >24 mIU/mL   SARS Coronavirus 2 by RT  PCR (hospital order, performed in [facility] hospital lab) Nasopharyngeal Nasopharyngeal Swab     Status: None   Collection Time: 04/19/20 10:28 PM   Specimen: Nasopharyngeal Swab  Result Value Ref Range   SARS Coronavirus 2 NEGATIVE NEGATIVE    Comment: (NOTE) SARS-CoV-2 target nucleic acids are NOT DETECTED.  The SARS-CoV-2 RNA is generally detectable in upper and lower respiratory specimens during the acute phase of infection. The lowest concentration of SARS-CoV-2 viral copies this assay can detect is 250 copies / mL. A negative result does not preclude SARS-CoV-2 infection and should not be used as the sole basis for treatment or other patient management decisions.  A negative result may occur with improper specimen collection / handling, submission of specimen other than nasopharyngeal swab, presence of viral mutation(s) within the areas targeted by this assay, and inadequate number of  viral copies (<250 copies / mL). A negative result must be combined with clinical observations, patient history, and epidemiological information.  Fact Sheet for Patients:   BoilerBrush.com.cyhttps://www.fda.gov/media/136312/download  Fact Sheet for Healthcare Providers: https://pope.com/https://www.fda.gov/media/136313/download  This test is not yet approved or  cleared by the [place] FDA and has been authorized for detection and/or diagnosis of SARS-CoV-2 by FDA under an Emergency Use Authorization (EUA).  This EUA will remain in effect (meaning this test can be used) for the duration of the COVID-19 declaration under Section 564(b)(1) of the Act, 21 U.S.C. section 360bbb-3(b)(1), unless the authorization is terminated or revoked sooner.  Performed at [facility], [address] [place], [place] [address]     Blood Alcohol level:  Lab Results  Component Value Date   [facility] <10 04/19/2020   ETH <5 01/07/2017    Metabolic Disorder Labs: Lab Results  Component Value Date   HGBA1C 5.1 01/09/2017   MPG 100 01/09/2017   No results found for: PROLACTIN Lab Results  Component Value Date   CHOL 140 01/09/2017   TRIG 66 01/09/2017   HDL 52 01/09/2017   CHOLHDL 2.7 01/09/2017   VLDL 13 01/09/2017   LDLCALC 75 01/09/2017    Physical Findings: AIMS:  , ,  ,  ,    CIWA:    COWS:     Musculoskeletal: Strength & Muscle Tone: within normal limits Gait & Station: normal Patient leans: N/A  Psychiatric Specialty Exam: Physical Exam  Nursing note and vitals reviewed. Constitutional: She appears well-developed.  HENT:  Head: Normocephalic and atraumatic.  Eyes: Pupils are equal, round, and reactive to light. Conjunctivae are normal.  Cardiovascular: Normal heart sounds.  Respiratory: Effort normal.  GI: Soft.  Musculoskeletal:        General: Normal range of motion.     Cervical back: Normal range of motion.  Neurological: She is alert.  Skin: Skin is warm and dry.  Psychiatric: Mood  normal.    Review of Systems  Constitutional: Negative.   HENT: Negative.   Eyes: Negative.   Respiratory: Negative.   Cardiovascular: Negative.   Gastrointestinal: Negative.   Musculoskeletal: Negative.   Skin: Negative.   Neurological: Negative.   Psychiatric/Behavioral: Negative.     Blood pressure 114/77, pulse 84, temperature 99.1 F (37.3 C), temperature source Oral, resp. rate 16, height 5\' 6"  (1.676 m), weight 86.2 kg, last menstrual period 03/25/2020, SpO2 94 %.Body mass index is 30.67 kg/m.  General Appearance: Casual  Eye Contact:  Good  Speech:  Blocked  Volume:  Decreased  Mood:  Euthymic  Affect:  Congruent  Thought Process:  Goal  Directed  Orientation:  Full (Time, Place, and Person)  Thought Content:  Logical  Suicidal Thoughts:  No  Homicidal Thoughts:  No  Memory:  Immediate;   Fair Recent;   Fair Remote;   Fair  Judgement:  Fair  Insight:  Fair  Psychomotor Activity:  Normal  Concentration:  Concentration: Fair  Recall:  [employer] of Knowledge:  Fair  Language:  Fair  Akathisia:  No  Handed:  Right  AIMS (if indicated):     Assets:  Desire for Improvement Physical Health Resilience Social Support  ADL's:  Intact  Cognition:  WNL  Sleep:  Number of Hours: 4.75     Treatment Plan Summary: Daily contact with patient to assess and evaluate symptoms and progress in treatment, Medication management and Plan Continue current medicine.  Encourage group attendance.  Patient will work with treatment team on forming plans for outpatient follow-up.  Mordecai Rasmussen, MD 04/21/2020, 3:41 PM

## 2020-04-21 NOTE — Tx Team (Addendum)
Interdisciplinary Treatment and Diagnostic Plan Update  04/21/2020 Time of Session: 9am Alicia Whitney MRN: [id number]  Principal Diagnosis: Bipolar depression [facility])  Secondary Diagnoses: Principal Problem:   Bipolar depression ([place]) Active Problems:   PTSD (post-traumatic stress disorder)   Amphetamine abuse ([place])   Current Medications:  Current Facility-Administered Medications  Medication Dose Route Frequency Provider Last Rate Last Admin  . acetaminophen (TYLENOL) tablet 650 mg  650 mg Oral Q6H PRN Caroline Sauger, NP      . alum & mag hydroxide-simeth (MAALOX/MYLANTA) 200-200-20 MG/5ML suspension 30 mL  30 mL Oral Q4H PRN Caroline Sauger, NP      . hydrOXYzine (ATARAX/VISTARIL) tablet 25 mg  25 mg Oral Q8H PRN Caroline Sauger, NP      . magnesium hydroxide (MILK OF MAGNESIA) suspension 30 mL  30 mL Oral Daily PRN Caroline Sauger, NP      . prazosin (MINIPRESS) capsule 1 mg  1 mg Oral QHS Clapacs, Madie Reno, MD   1 mg at 04/20/20 2150  . promethazine (PHENERGAN) tablet 12.5 mg  12.5 mg Oral Q6H PRN Clapacs, John T, MD   12.5 mg at 04/21/20 1140  . sertraline (ZOLOFT) tablet 50 mg  50 mg Oral QHS Caroline Sauger, NP   50 mg at 04/20/20 2149  . traZODone (DESYREL) tablet 75 mg  75 mg Oral QHS Caroline Sauger, NP   75 mg at 04/20/20 2149   PTA Medications: Medications Prior to Admission  Medication Sig Dispense Refill Last Dose  . chlorpheniramine-HYDROcodone (TUSSIONEX PENNKINETIC ER) 10-8 MG/5ML SUER Take 5 mLs by mouth 2 (two) times daily. 140 mL 0   . gabapentin (NEURONTIN) 300 MG capsule 300 mg twice daily for 4 weeks and then increase to 300 mg three times a day if no side effects 90 capsule 1   . hydrOXYzine (ATARAX/VISTARIL) 25 MG tablet Take 1 tablet (25 mg total) by mouth every 8 (eight) hours as needed. (Patient taking differently: Take 25 mg by mouth every 8 (eight) hours as needed for anxiety. ) 15 tablet 0   . lamoTRIgine (LAMICTAL) 25 MG  tablet Take 50 mg by mouth at bedtime.     Marland Kitchen levofloxacin (LEVAQUIN) 750 MG tablet Take 1 tablet (750 mg total) by mouth daily. 5 tablet 0   . methocarbamol (ROBAXIN) 750 MG tablet Take 1 tablet (750 mg total) by mouth every 6 (six) hours as needed for muscle spasms (Pain). 40 tablet 0   . sertraline (ZOLOFT) 50 MG tablet Take 1 tablet (50 mg total) by mouth at bedtime. 30 tablet 0   . traZODone (DESYREL) 150 MG tablet Take 75 mg by mouth at bedtime.     . trolamine salicylate (ASPERCREME/ALOE) 10 % cream Apply 1 application topically as needed for muscle pain. 85 g 0     Patient Stressors: Loss of children  Marital or family conflict Medication change or noncompliance Substance abuse  Patient Strengths: General fund of knowledge Motivation for treatment/growth Supportive family/friends  Treatment Modalities: Medication Management, Group therapy, Case management,  1 to 1 session with clinician, Psychoeducation, Recreational therapy.   Physician Treatment Plan for Primary Diagnosis: Bipolar depression ([place]) Long Term Goal(s): Improvement in symptoms so as ready for discharge Improvement in symptoms so as ready for discharge   Short Term Goals: Ability to verbalize feelings will improve Ability to disclose and discuss suicidal ideas Compliance with prescribed medications will improve  Medication Management: Evaluate patient's response, side effects, and tolerance of medication regimen.  Therapeutic Interventions: 1  to 1 sessions, Unit Group sessions and Medication administration.  Evaluation of Outcomes: Progressing  Physician Treatment Plan for Secondary Diagnosis: Principal Problem:   Bipolar depression (HCC) Active Problems:   PTSD (post-traumatic stress disorder)   Amphetamine abuse (HCC)  Long Term Goal(s): Improvement in symptoms so as ready for discharge Improvement in symptoms so as ready for discharge   Short Term Goals: Ability to verbalize feelings will  improve Ability to disclose and discuss suicidal ideas Compliance with prescribed medications will improve     Medication Management: Evaluate patient's response, side effects, and tolerance of medication regimen.  Therapeutic Interventions: 1 to 1 sessions, Unit Group sessions and Medication administration.  Evaluation of Outcomes: Progressing   RN Treatment Plan for Primary Diagnosis: Bipolar depression (HCC) Long Term Goal(s): Knowledge of disease and therapeutic regimen to maintain health will improve  Short Term Goals: Ability to participate in decision making will improve, Ability to verbalize feelings will improve, Ability to disclose and discuss suicidal ideas, Ability to identify and develop effective coping behaviors will improve and Compliance with prescribed medications will improve  Medication Management: RN will administer medications as ordered by provider, will assess and evaluate patient's response and provide education to patient for prescribed medication. RN will report any adverse and/or side effects to prescribing provider.  Therapeutic Interventions: 1 on 1 counseling sessions, Psychoeducation, Medication administration, Evaluate responses to treatment, Monitor vital signs and CBGs as ordered, Perform/monitor CIWA, COWS, AIMS and Fall Risk screenings as ordered, Perform wound care treatments as ordered.  Evaluation of Outcomes: Progressing   LCSW Treatment Plan for Primary Diagnosis: Bipolar depression (HCC) Long Term Goal(s): Safe transition to appropriate next level of care at discharge, Engage patient in therapeutic group addressing interpersonal concerns.  Short Term Goals: Engage patient in aftercare planning with referrals and resources  Therapeutic Interventions: Assess for all discharge needs, 1 to 1 time with Social worker, Explore available resources and support systems, Assess for adequacy in community support network, Educate family and significant  other(s) on suicide prevention, Complete Psychosocial Assessment, Interpersonal group therapy.  Evaluation of Outcomes: Progressing   Progress in Treatment: Attending groups: Yes. Participating in groups: Yes. Taking medication as prescribed: Yes. Toleration medication: Yes. Family/Significant other contact made: No, will contact:  pt husband Patient understands diagnosis: Yes. Discussing patient identified problems/goals with staff: Yes. Medical problems stabilized or resolved: Yes. Denies suicidal/homicidal ideation: Yes. Issues/concerns per patient self-inventory: No. Other: NA  New problem(s) identified: No, Describe:  none reported  New Short Term/Long Term Goal(s):Attend outpatient treatment, develop and implement healthy coping methods, take medication as prescribed  Patient Goals: "Attend as many groups as possible"   Discharge Plan or Barriers: Pt will return home and follow up with outpatient treatment  Reason for Continuation of Hospitalization: Medication stabilization  Estimated Length of Stay:Pt scheduled for discharge on 04/22/20    Recreational Therapy: Patient: N/A Patient Goal: Patient will engage in groups without prompting or encouragement from LRT x3 group sessions within 5 recreation therapy group sessions.  Attendees: Patient:Alicia Whitney 04/21/2020 12:24 PM  Physician: Mordecai Rasmussen 04/21/2020 12:24 PM  Nursing: Lorenda Hatchet, nurse 04/21/2020 12:24 PM  RN Care Manager: 04/21/2020 12:24 PM  Social Worker: Ferd Hibbs 04/21/2020 12:24 PM  Recreational Therapist: Garret Reddish 04/21/2020 12:24 PM  Other:  04/21/2020 12:24 PM  Other:  04/21/2020 12:24 PM  Other: 04/21/2020 12:24 PM    Scribe for Treatment Team: Suzan Slick, LCSW 04/21/2020 12:24 PM

## 2020-04-21 NOTE — [facility] Group Notes (Signed)
[facility] Group Notes:  (Nursing/MHT/Case Management/Adjunct)  Date:  04/21/2020  Time:  9:31 PM  Type of Therapy:  Group Therapy  Participation Level:  Did Not Attend  Participation Quality:  Patient said she was slp.    Summary of Progress/Problems:  Alicia Whitney 04/21/2020, 9:31 PM

## 2020-04-21 NOTE — [facility] Group Notes (Signed)
Balance In Life 04/21/2020 9:30AM/1PM  Type of Therapy/Topic:  Group Therapy:  Balance in Life  Participation Level:  Did Not Attend  Description of Group:   This group will address the concept of balance and how it feels and looks when one is unbalanced. Patients will be encouraged to process areas in their lives that are out of balance and identify reasons for remaining unbalanced. Facilitators will guide patients in utilizing problem-solving interventions to address and correct the stressor making their life unbalanced. Understanding and applying boundaries will be explored and addressed for obtaining and maintaining a balanced life. Patients will be encouraged to explore ways to assertively make their unbalanced needs known to significant others in their lives, using other group members and facilitator for support and feedback.  Therapeutic Goals: 1. Patient will identify two or more emotions or situations they have that consume much of in their lives. 2. Patient will identify signs/triggers that life has become out of balance:  3. Patient will identify two ways to set boundaries in order to achieve balance in their lives:  4. Patient will demonstrate ability to communicate their needs through discussion and/or role plays  Summary of Patient Progress:    Therapeutic Modalities:   Cognitive Behavioral Therapy Solution-Focused Therapy Assertiveness Training  Dorthia Tout T Tanairi Cypert, LCSW  

## 2020-04-22 MED ORDER — TRAZODONE HCL 150 MG PO TABS: 75.0000 mg | ORAL_TABLET | Freq: Every day | ORAL | 1 refills | Status: AC

## 2020-04-22 MED ORDER — HYDROXYZINE HCL 25 MG PO TABS: 25.0000 mg | ORAL_TABLET | Freq: Three times a day (TID) | ORAL | 1 refills | Status: AC | PRN

## 2020-04-22 MED ORDER — PRAZOSIN HCL 1 MG PO CAPS: 1.0000 mg | ORAL_CAPSULE | Freq: Every day | ORAL | 1 refills | Status: AC

## 2020-04-22 MED ORDER — HYDROXYZINE HCL 25 MG PO TABS: 25.0000 mg | ORAL_TABLET | Freq: Three times a day (TID) | ORAL | 0 refills | Status: DC | PRN

## 2020-04-22 MED ORDER — SERTRALINE HCL 50 MG PO TABS: 50.0000 mg | ORAL_TABLET | Freq: Every day | ORAL | 0 refills | Status: DC

## 2020-04-22 MED ORDER — PROMETHAZINE HCL 12.5 MG PO TABS: 12.5000 mg | ORAL_TABLET | Freq: Four times a day (QID) | ORAL | 0 refills | Status: DC | PRN

## 2020-04-22 MED ORDER — TRAZODONE HCL 150 MG PO TABS: 75.0000 mg | ORAL_TABLET | Freq: Every day | ORAL | 0 refills | Status: DC

## 2020-04-22 MED ORDER — PROMETHAZINE HCL 12.5 MG PO TABS: 12.5000 mg | ORAL_TABLET | Freq: Four times a day (QID) | ORAL | 1 refills | Status: AC | PRN

## 2020-04-22 MED ORDER — SERTRALINE HCL 50 MG PO TABS: 50.0000 mg | ORAL_TABLET | Freq: Every day | ORAL | 1 refills | Status: AC

## 2020-04-22 MED ORDER — PRAZOSIN HCL 1 MG PO CAPS: 1.0000 mg | ORAL_CAPSULE | Freq: Every day | ORAL | 0 refills | Status: DC

## 2020-04-22 NOTE — Discharge Summary (Signed)
Physician Discharge Summary Note  Patient:  Alicia Whitney is an 34 y.o., female MRN:  [id number] DOB:  08-21-1986 Patient phone:  [phone] (home)  Patient address:   [address] [place] [address],  Total Time spent with patient: 30 minutes  Date of Admission:  04/20/2020 Date of Discharge: April 22, 2020  Reason for Admission: Admitted because of suicidal ideation in the context of relapse into methamphetamine and mood instability behavior instability  Principal Problem: Bipolar depression [facility]) Discharge Diagnoses: Principal Problem:   Bipolar depression ([place]) Active Problems:   PTSD (post-traumatic stress disorder)   Amphetamine abuse ([place])   Past Psychiatric History: History of substance abuse PTSD anxiety problems in the past  Past Medical History:  Past Medical History:  Diagnosis Date  . Anxiety   . Bipolar disorder ([place])   . Depression   . Seizure ([place])    last around age 48    Past Surgical History:  Procedure Laterality Date  . CESAREAN SECTION    . DILATION AND CURETTAGE OF UTERUS    . THORACOTOMY Left 11/20/2018   Procedure: PREOP BRONCHOSCOPY, THORACOTOMY AND LUNG RESECTION;  Surgeon: Nestor Lewandowsky, MD;  Location: [facility] ORS;  Service: General;  Laterality: Left;  . THORACOTOMY    . TONSILLECTOMY    . TUBAL LIGATION     Family History:  Family History  Problem Relation Age of Onset  . 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 Marital status: Married    Spouse name: Not on file  . Number of children: Not on file  . Years of education: Not on file  . Highest education level: Not on file  Occupational History  . Not on file  Tobacco Use  . Smoking status: Former Smoker     Packs/day: 0.25    Years: 1.00    Pack years: 0.25    Types: Cigarettes    Quit date: 2012    Years since quitting: 9.4  . Smokeless tobacco: Never Used  Vaping Use  . Vaping Use: Never used  Substance and Sexual Activity  . Alcohol use: Not Currently    Comment: sober since 2018  . Drug use: Not Currently  . Sexual activity: Not on file  Other Topics Concern  . Not on file  Social History Narrative  . Not on file   Social Determinants of Health   Financial Resource Strain:   . Difficulty of Paying Living Expenses:   Food Insecurity:   . Worried About Charity fundraiser in the Last Year:   . Arboriculturist in the Last Year:   Transportation Needs:   . Film/video editor (Medical):   Marland Kitchen Lack of Transportation (Non-Medical):   Physical Activity:   . Days of Exercise per Week:   . Minutes of Exercise per Session:   Stress:   . Feeling of Stress :   Social Connections:   . Frequency of Communication with Friends and Family:   . Frequency of Social Gatherings with Friends and Family:   . Attends Religious Services:   . Active Member of Clubs or Organizations:   . Attends Archivist Meetings:   .  Marital Status:     Hospital Course: Admitted to the psychiatric unit.  15-minute checks.  Patient showed no dangerous behavior in the hospital.  She was started back on medicine for depression.  Tolerated it well.  Attended groups and activities and met with treatment team.  At the time of discharge she was feeling upbeat and optimistic and agreeing to attend intensive outpatient program  Physical Findings: AIMS:  , ,  ,  ,    CIWA:    COWS:     Musculoskeletal: Strength & Muscle Tone: within normal limits Gait & Station: normal Patient leans: N/A  Psychiatric Specialty Exam: Physical Exam  Nursing note and vitals reviewed. Constitutional: She appears well-developed.  HENT:  Head: Normocephalic and atraumatic.  Eyes: Pupils are equal, round, and  reactive to light. Conjunctivae are normal.  Cardiovascular: Normal heart sounds.  Respiratory: Effort normal.  GI: Soft.  Musculoskeletal:        General: Normal range of motion.     Cervical back: Normal range of motion.  Neurological: She is alert.  Skin: Skin is warm and dry.  Psychiatric: Mood normal.    Review of Systems  Constitutional: Negative.   HENT: Negative.   Eyes: Negative.   Respiratory: Negative.   Cardiovascular: Negative.   Gastrointestinal: Negative.   Musculoskeletal: Negative.   Skin: Negative.   Neurological: Negative.   Psychiatric/Behavioral: Negative.     Blood pressure 118/75, pulse 64, temperature 98.7 F (37.1 C), temperature source Oral, resp. rate 18, height '5\' 6"'  (1.676 m), weight 86.2 kg, last menstrual period 03/25/2020, SpO2 99 %.Body mass index is 30.67 kg/m.  General Appearance: Casual  Eye Contact:  Good  Speech:  Clear and Coherent  Volume:  Normal  Mood:  Euthymic  Affect:  Congruent  Thought Process:  Goal Directed  Orientation:  Full (Time, Place, and Person)  Thought Content:  Logical  Suicidal Thoughts:  No  Homicidal Thoughts:  No  Memory:  Immediate;   Fair Recent;   Fair Remote;   Fair  Judgement:  Fair  Insight:  Fair  Psychomotor Activity:  Normal  Concentration:  Concentration: Fair  Recall:  Aldrich of Knowledge:  Fair  Language:  Fair  Akathisia:  Yes  Handed:  Right  AIMS (if indicated):     Assets:  Housing Physical Health  ADL's:  Intact  Cognition:  WNL  Sleep:  Number of Hours: 7.5     Have you used any form of tobacco in the last 30 days? (Cigarettes, Smokeless Tobacco, Cigars, and/or Pipes): No  Has this patient used any form of tobacco in the last 30 days? (Cigarettes, Smokeless Tobacco, Cigars, and/or Pipes) Yes, No  Blood Alcohol level:  Lab Results  Component Value Date   ETH <10 04/19/2020   ETH <5 51/88/4166    Metabolic Disorder Labs:  Lab Results  Component Value Date   HGBA1C  5.1 01/09/2017   MPG 100 01/09/2017   No results found for: PROLACTIN Lab Results  Component Value Date   CHOL 140 01/09/2017   TRIG 66 01/09/2017   HDL 52 01/09/2017   CHOLHDL 2.7 01/09/2017   VLDL 13 01/09/2017   Leach 75 01/09/2017    See Psychiatric Specialty Exam and Suicide Risk Assessment completed by Attending Physician prior to discharge.  Discharge destination:  Home  Is patient on multiple antipsychotic therapies at discharge:  No   Has Patient had three or more failed trials of antipsychotic monotherapy by history:  No  Recommended Plan for Multiple Antipsychotic Therapies: NA  Discharge Instructions    Diet - low sodium heart healthy   Complete by: As directed    Increase activity slowly   Complete by: As directed      Allergies as of 04/22/2020      Reactions   Morphine And Related Nausea And Vomiting   Does not work for pt either   Onion Swelling, Other (See Comments)   Throat swelling   Latex Rash      Medication List    STOP taking these medications   chlorpheniramine-HYDROcodone 10-8 MG/5ML Suer Commonly known as: Tussionex Pennkinetic ER   gabapentin 300 MG capsule Commonly known as: Neurontin   lamoTRIgine 25 MG tablet Commonly known as: LAMICTAL   levofloxacin 750 MG tablet Commonly known as: Levaquin   methocarbamol 750 MG tablet Commonly known as: ROBAXIN   trolamine salicylate 10 % cream Commonly known as: Aspercreme/Aloe     TAKE these medications     Indication  hydrOXYzine 25 MG tablet Commonly known as: ATARAX/VISTARIL Take 1 tablet (25 mg total) by mouth every 8 (eight) hours as needed for anxiety.  Indication: Feeling Anxious, anxiety   prazosin 1 MG capsule Commonly known as: MINIPRESS Take 1 capsule (1 mg total) by mouth at bedtime.  Indication: Frightening Dreams   promethazine 12.5 MG tablet Commonly known as: PHENERGAN Take 1 tablet (12.5 mg total) by mouth every 6 (six) hours as needed for nausea or  vomiting.  Indication: Nausea and Vomiting   sertraline 50 MG tablet Commonly known as: ZOLOFT Take 1 tablet (50 mg total) by mouth at bedtime.  Indication: Major Depressive Disorder, MDD   traZODone 150 MG tablet Commonly known as: DESYREL Take 0.5 tablets (75 mg total) by mouth at bedtime.  Indication: [place] Follow up.   Why: SAIOP Wednesday 04/27/2020 at 9:30AM. Thanks! Contact information: [place] [address] [phone]               Follow-up recommendations:  Activity:  Activity as tolerated Diet:  Regular diet Other:  Follow-up with [facility] outpatient  Comments: Prescriptions provided at discharge  Signed: Alethia Berthold, MD 04/22/2020, 4:44 PM

## 2020-04-22 NOTE — Progress Notes (Signed)
Patient ID: Alicia Whitney, female   DOB: Jun 26, 1986, 34 y.o.   MRN: [id number]  Discharge Note:  Patient denies SI/HI/AVH at this time. Discharge instructions, AVS, prescriptions, and transition record gone over with patient. Patient agrees to comply with medication management, follow-up visit, and outpatient therapy. Patient belongings returned to patient. Patient questions and concerns addressed and answered. Patient ambulatory off unit. Patient discharged to home with her Husband.

## 2020-04-22 NOTE — Progress Notes (Signed)
D- Patient alert and oriented. Patient presents in a pleasant mood on assessment reporting that she slept good last night and had no complaints to voice to this Clinical research associate. Patient denies any signs/symptoms of depression/anxiety. Patient also denies SI, HI, AVH, and pain at this time. Patient's goal for today is "to attend groups and work on going home".  A- Support and encouragement provided. Routine safety checks conducted every 15 minutes. Patient informed to notify staff with problems or concerns.  R- Patient contracts for safety at this time. Patient compliant with treatment plan. Patient receptive, calm, and cooperative.Patient interacts well with others on the unit. Patient remains safe at this time.

## 2020-04-22 NOTE — Progress Notes (Signed)
°  [facility] Adult Case Management Discharge Plan :  Will you be returning to the same living situation after discharge:  Yes,  pt reports she is returning home. At discharge, do you have transportation home?: Yes,  pt reports that her husband will provide transportation. Do you have the ability to pay for your medications: No.  Release of information consent forms completed and in the chart;  Patient's signature needed at discharge.  Patient to Follow up at:  Follow-up Information    [facility] Follow up.   Why: SAIOP Wednesday 04/27/2020 at 9:30AM. Thanks! Contact information: [address] [place] [address] [phone]               Next level of care provider has access to [facility] Link:no  Safety Planning and Suicide Prevention discussed: Yes,  SPE completed with pt's husband.  Have you used any form of tobacco in the last 30 days? (Cigarettes, Smokeless Tobacco, Cigars, and/or Pipes): No  Has patient been referred to the Quitline?: Patient refused referral  Patient has been referred for addiction treatment: Pt. refused referral  Harden Mo, LCSW 04/22/2020, 10:09 AM

## 2020-04-22 NOTE — BHH Group Notes (Signed)
LCSW Group Therapy Note  04/22/2020 1:00 PM  Type of Therapy/Topic:  Group Therapy:  Emotion Regulation  Participation Level:  Did Not Attend   Description of Group:   The purpose of this group is to assist patients in learning to regulate negative emotions and experience positive emotions. Patients will be guided to discuss ways in which they have been vulnerable to their negative emotions. These vulnerabilities will be juxtaposed with experiences of positive emotions or situations, and patients will be challenged to use positive emotions to combat negative ones. Special emphasis will be placed on coping with negative emotions in conflict situations, and patients will process healthy conflict resolution skills.  Therapeutic Goals: 1. Patient will identify two positive emotions or experiences to reflect on in order to balance out negative emotions 2. Patient will label two or more emotions that they find the most difficult to experience 3. Patient will demonstrate positive conflict resolution skills through discussion and/or role plays  Summary of Patient Progress: X  Therapeutic Modalities:   Cognitive Behavioral Therapy Feelings Identification Dialectical Behavioral Therapy  Perl Folmar, MSW, LCSW 04/22/2020 2:26 PM  

## 2020-04-22 NOTE — Progress Notes (Signed)
Recreation Therapy Notes   Date: 04/22/2020  Time: 9:30 am  Location: Craft room   Behavioral response: Appropriate  Intervention Topic: Happiness    Discussion/Intervention:  Group content today was focused on Happiness. The group defined happiness and described where happiness comes from. Individuals identified what makes them happy and how they go about making others happy. Patients expressed things that stop them from being happy and ways they can improve their happiness. The group stated reasons why it is important to be happy. The group participated in the intervention "My Happiness", where they had a chance to identify and express things that make them happy. Clinical Observations/Feedback:  Patient came to group and expressed that her family, and dog are what makes her happy. She stated that her happiness comes from seeing others happy. Participant explained that happiness is important for a positive environment. Individual was social with peers and staff while participating on the intervention.  Lin Glazier LRT/CTRS         Vickie Melnik 04/22/2020 11:39 AM

## 2020-04-22 NOTE — BHH Suicide Risk Assessment (Signed)
[facility] Discharge Suicide Risk Assessment   Principal Problem: Bipolar depression [facility]) Discharge Diagnoses: Principal Problem:   Bipolar depression (HCC) Active Problems:   PTSD (post-traumatic stress disorder)   Amphetamine abuse (HCC)   Total Time spent with patient: 30 minutes  Musculoskeletal: Strength & Muscle Tone: within normal limits Gait & Station: normal Patient leans: N/A  Psychiatric Specialty Exam: Review of Systems  Constitutional: Negative.   HENT: Negative.   Eyes: Negative.   Respiratory: Negative.   Cardiovascular: Negative.   Gastrointestinal: Negative.   Musculoskeletal: Negative.   Skin: Negative.   Neurological: Negative.   Psychiatric/Behavioral: Negative.     Blood pressure 118/75, pulse 64, temperature 98.7 F (37.1 C), temperature source Oral, resp. rate 18, height 5\' 6"  (1.676 m), weight 86.2 kg, last menstrual period 03/25/2020, SpO2 99 %.Body mass index is 30.67 kg/m.  General Appearance: Casual  Eye Contact::  Good  Speech:  Clear and Coherent409  Volume:  Normal  Mood:  Euthymic  Affect:  Congruent  Thought Process:  Goal Directed  Orientation:  Full (Time, Place, and Person)  Thought Content:  Logical  Suicidal Thoughts:  No  Homicidal Thoughts:  No  Memory:  Immediate;   Fair Recent;   Fair Remote;   Fair  Judgement:  Fair  Insight:  Fair  Psychomotor Activity:  Normal  Concentration:  Fair  Recall:  [phone] of Knowledge:Fair  Language: Fair  Akathisia:  No  Handed:  Right  AIMS (if indicated):     Assets:  Desire for Improvement  Sleep:  Number of Hours: 7.5  Cognition: WNL  ADL's:  Intact   Mental Status Per Nursing Assessment::   On Admission:  Suicidal ideation indicated by patient  Demographic Factors:  Caucasian  Loss Factors: Decline in physical health  Historical Factors: Impulsivity  Risk Reduction Factors:   Living with another person, especially a relative, Positive social support and Positive  therapeutic relationship  Continued Clinical Symptoms:  Depression:   Comorbid alcohol abuse/dependence Alcohol/Substance Abuse/Dependencies  Cognitive Features That Contribute To Risk:  None    Suicide Risk:  Minimal: No identifiable suicidal ideation.  Patients presenting with no risk factors but with morbid ruminations; may be classified as minimal risk based on the severity of the depressive symptoms    Plan Of Care/Follow-up recommendations:  Activity:  Activity as tolerated Diet:  Regular diet Other:  Follow-up with outpatient treatment at [facility]  [facility], MD 04/22/2020, 9:33 AM

## 2020-06-07 ENCOUNTER — Other Ambulatory Visit: Payer: Self-pay | Admitting: Psychiatry

## 2020-06-13 ENCOUNTER — Other Ambulatory Visit: Payer: Self-pay | Admitting: Psychiatry

## 2020-11-13 ENCOUNTER — Emergency Department: Payer: HRSA Program

## 2020-11-13 ENCOUNTER — Encounter: Payer: Self-pay | Admitting: Intensive Care

## 2020-11-13 ENCOUNTER — Other Ambulatory Visit: Payer: Self-pay

## 2020-11-13 ENCOUNTER — Emergency Department
Admission: EM | Admit: 2020-11-13 | Discharge: 2020-11-13 | Disposition: A | Discharge status: Home / Self Care | Payer: HRSA Program | Attending: Emergency Medicine | Admitting: Emergency Medicine

## 2020-11-13 DIAGNOSIS — R0602 Shortness of breath: Secondary | ICD-10-CM | POA: Diagnosis present

## 2020-11-13 DIAGNOSIS — Z87891 Personal history of nicotine dependence: Secondary | ICD-10-CM | POA: Diagnosis not present

## 2020-11-13 DIAGNOSIS — U071 COVID-19: Secondary | ICD-10-CM | POA: Insufficient documentation

## 2020-11-13 DIAGNOSIS — Z91040 Latex allergy status: Secondary | ICD-10-CM | POA: Diagnosis not present

## 2020-11-13 LAB — TROPONIN I (HIGH SENSITIVITY): Troponin I (High Sensitivity): <2 ng/L (ref <18)

## 2020-11-13 LAB — POC SARS CORONAVIRUS 2 AG -  ED: SARS Coronavirus 2 Ag: POSITIVE — AB

## 2020-11-13 MED ORDER — DEXAMETHASONE SODIUM PHOSPHATE 10 MG/ML IJ SOLN
8.0000 mg | Freq: Once | INTRAMUSCULAR | Status: AC
  Administered 2020-11-13: 8 mg via INTRAMUSCULAR
  Filled 2020-11-13: qty 1

## 2020-11-13 NOTE — ED Notes (Signed)
Called x 2 no answer

## 2020-11-13 NOTE — ED Provider Notes (Signed)
[facility] Emergency Department Provider Note   ____________________________________________    I have reviewed the triage vital signs and the nursing notes.   HISTORY  Chief Complaint Shortness of Breath and Fever     HPI Alicia Whitney is a 35 y.o. female who presents with complaints of shortness of breath, fever, chills, fatigue, body aches for several days.  She has not been vaccinated gets COVID-19.  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  She has been quarantining herself.  Has not take anything for this  Past Medical History:  Diagnosis Date  . Anxiety   . Bipolar disorder (HCC)   . Depression   . Seizure [facility])    last around age 70    Patient Active Problem List   Diagnosis Date Noted  . Bipolar depression (HCC) 04/20/2020  . Amphetamine abuse (HCC) 04/20/2020  . 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 Dyspnea 03/19/2019  . Mass of lower lobe of left lung 11/20/2018  . Lung mass   . Lung nodules 10/19/2018  . PTSD (post-traumatic stress disorder) 01/09/2017  . Cocaine use disorder, moderate, dependence (HCC) 01/09/2017  . Opioid use disorder, moderate, dependence (HCC) 01/09/2017  . Cannabis use disorder, mild, abuse 01/09/2017  . 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 Severe recurrent major depression without psychotic features (HCC) 01/08/2017    Past Surgical History:  Procedure Laterality Date  . CESAREAN SECTION    . DILATION AND CURETTAGE OF UTERUS    . THORACOTOMY Left 11/20/2018   Procedure: PREOP BRONCHOSCOPY, THORACOTOMY AND LUNG RESECTION;  Surgeon: Hulda Marin, MD;  Location: [facility] ORS;  Service: General;  Laterality: Left;  . THORACOTOMY    . TONSILLECTOMY    . TUBAL LIGATION      Prior to Admission medications   Medication Sig Start Date End Date Taking? Authorizing Provider  hydrOXYzine (ATARAX/VISTARIL)  25 MG tablet Take 1 tablet (25 mg total) by mouth every 8 (eight) hours as needed for anxiety. 04/22/20   Clapacs, Jackquline Denmark, MD  prazosin (MINIPRESS) 1 MG capsule Take 1 capsule (1 mg total) by mouth at bedtime. 04/22/20   Clapacs, Jackquline Denmark, MD  promethazine (PHENERGAN) 12.5 MG tablet Take 1 tablet (12.5 mg total) by mouth every 6 (six) hours as needed for nausea or vomiting. 04/22/20   Clapacs, Jackquline Denmark, MD  sertraline (ZOLOFT) 50 MG tablet Take 1 tablet (50 mg total) by mouth at bedtime. 04/22/20   Clapacs, Jackquline Denmark, MD  traZODone (DESYREL) 150 MG tablet Take 0.5 tablets (75 mg total) by mouth at bedtime. 04/22/20   Clapacs, Jackquline Denmark, MD     Allergies Morphine and related, Onion, and Latex  Family History  Problem Relation Age of Onset  . Cancer Maternal Aunt     Social History Social History   Tobacco Use  . Smoking status: Former Smoker    Packs/day: 0.25    Years: 1.00    Pack years: 0.25    Types: Cigarettes    Quit date: 2012    Years since quitting: 10.0  . Smokeless tobacco: Never Used  Vaping Use  . Vaping Use: Never used  Substance Use Topics  . Alcohol use: Yes    Alcohol/week: 2.0 standard drinks    Types: 2 Glasses of wine per week  . Drug use: Not Currently    Review of Systems  Constitutional:  Fevers Eyes: No visual changes.  ENT: No sore throat. Cardiovascular: Some chest discomfort Respiratory: Cough Gastrointestinal: No abdominal pain.  No nausea, no vomiting.   Genitourinary: Negative for dysuria. Musculoskeletal: Myalgias Skin: Negative for rash. Neurological: Negative forweakness   ____________________________________________   PHYSICAL EXAM:  VITAL SIGNS: ED Triage Vitals  Enc Vitals Group     BP 11/13/20 1825 129/84     Pulse Rate 11/13/20 1825 (!) 105     Resp 11/13/20 1825 (!) 22     Temp 11/13/20 1825 98.5 F (36.9 C)     Temp Source 11/13/20 1825 Oral     SpO2 11/13/20 1825 100 %     Weight 11/13/20 1826 87.5 kg (193 lb)     Height  11/13/20 1826 1.702 m (5\' 7" )     Head Circumference --      Peak Flow --      Pain Score 11/13/20 1826 5     Pain Loc --      Pain Edu? --      Excl. in GC? --     Constitutional: Alert and oriented. No acute distress.  Nose: No congestion/rhinnorhea. Mouth/Throat: Mucous membranes are moist.    Cardiovascular: Normal rate, regular rhythm. Grossly normal heart sounds.  Good peripheral circulation. Respiratory: Normal respiratory effort.  No retractions. Lungs CTAB. Gastrointestinal: Soft and nontender. No distention.  No CVA tenderness.  Musculoskeletal: No lower extremity tenderness nor edema.  Warm and well perfused Neurologic:  Normal speech and language. No gross focal neurologic deficits are appreciated.  Skin:  Skin is warm, dry and intact. No rash noted. Psychiatric: Mood and affect are normal. Speech and behavior are normal.  ____________________________________________   LABS (all labs ordered are listed, but only abnormal results are displayed)  Labs Reviewed  POC SARS CORONAVIRUS 2 AG -  ED - Abnormal; Notable for the following components:      Result Value   SARS Coronavirus 2 Ag Positive (*)    All other components within normal limits  SARS CORONAVIRUS 2 (TAT 6-24 HRS)  TROPONIN I (HIGH SENSITIVITY)   ____________________________________________  EKG  ED ECG REPORT I, 01/11/21, the attending physician, personally viewed and interpreted this ECG.  Date: 11/13/2020  Rhythm: normal sinus rhythm QRS Axis: normal Intervals: normal ST/T Wave abnormalities: normal Narrative Interpretation: no evidence of acute ischemia  ____________________________________________  RADIOLOGY  Chest x-ray read by me, no infiltrate or ____________________________________________   PROCEDURES  Procedure(s) performed: No  Procedures   Critical Care performed: No ____________________________________________   INITIAL IMPRESSION / ASSESSMENT AND PLAN / ED  COURSE  Pertinent labs & imaging results that were available during my care of the patient were reviewed by me and considered in my medical decision making (see chart for details).  Patient presents with symptoms consistent with COVID-19, does complain of some mild intermittent chest discomfort, EKG is reassuring suspect chest discomfort related to COVID-19.  Covid rapid antigen test is positive  Troponin is undetectable, chest x-ray without evidence of pneumonia, IM Decadron given.    ____________________________________________   FINAL CLINICAL IMPRESSION(S) / ED DIAGNOSES  Final diagnoses:  COVID-19        Note:  This document was prepared using Dragon voice recognition software and may include unintentional dictation errors.   01/11/2021, MD 11/13/20 [id number]

## 2020-11-13 NOTE — ED Notes (Signed)
Lab called for blood draw.

## 2020-11-13 NOTE — ED Notes (Signed)
Called x 1 with no answer. 

## 2020-11-13 NOTE — ED Triage Notes (Addendum)
Patient c/o fever, chills, rash on cheeks, phlegm, cold symptoms, and today sob/tightness in chest started a couple of hours ago. Patient has labored breathing in triage. Patient reports partial left lung removal

## 2020-11-14 ENCOUNTER — Ambulatory Visit: Payer: Self-pay

## 2020-11-14 LAB — SARS CORONAVIRUS 2 (TAT 6-24 HRS): SARS Coronavirus 2: POSITIVE — AB

## 2021-06-05 ENCOUNTER — Other Ambulatory Visit: Payer: Self-pay

## 2021-06-05 ENCOUNTER — Ambulatory Visit (LOCAL_COMMUNITY_HEALTH_CENTER): Payer: Self-pay

## 2021-06-05 DIAGNOSIS — Z111 Encounter for screening for respiratory tuberculosis: Secondary | ICD-10-CM

## 2021-06-08 ENCOUNTER — Other Ambulatory Visit: Payer: Self-pay

## 2021-06-08 ENCOUNTER — Ambulatory Visit (LOCAL_COMMUNITY_HEALTH_CENTER): Payer: Self-pay

## 2021-06-08 DIAGNOSIS — Z111 Encounter for screening for respiratory tuberculosis: Secondary | ICD-10-CM

## 2021-06-08 LAB — TB SKIN TEST
Induration: 0 mm
TB Skin Test: NEGATIVE
